# Patient Record
Sex: Male | Born: 1935 | Race: White | Hispanic: No | Marital: Married | Smoking: Former smoker
Health system: Southern US, Community
[De-identification: ages and names within clinical notes are randomized; demographics above are authoritative.]

## PROBLEM LIST (undated history)

## (undated) DIAGNOSIS — E7800 Pure hypercholesterolemia, unspecified: Secondary | ICD-10-CM

## (undated) DIAGNOSIS — J309 Allergic rhinitis, unspecified: Secondary | ICD-10-CM

## (undated) DIAGNOSIS — A048 Other specified bacterial intestinal infections: Secondary | ICD-10-CM

## (undated) DIAGNOSIS — K635 Polyp of colon: Secondary | ICD-10-CM

## (undated) DIAGNOSIS — J189 Pneumonia, unspecified organism: Secondary | ICD-10-CM

## (undated) DIAGNOSIS — K4090 Unilateral inguinal hernia, without obstruction or gangrene, not specified as recurrent: Secondary | ICD-10-CM

## (undated) DIAGNOSIS — K219 Gastro-esophageal reflux disease without esophagitis: Secondary | ICD-10-CM

## (undated) DIAGNOSIS — K649 Unspecified hemorrhoids: Secondary | ICD-10-CM

## (undated) DIAGNOSIS — I10 Essential (primary) hypertension: Secondary | ICD-10-CM

## (undated) DIAGNOSIS — M1990 Unspecified osteoarthritis, unspecified site: Secondary | ICD-10-CM

## (undated) DIAGNOSIS — C4490 Unspecified malignant neoplasm of skin, unspecified: Secondary | ICD-10-CM

## (undated) DIAGNOSIS — I998 Other disorder of circulatory system: Secondary | ICD-10-CM

## (undated) HISTORY — DX: Unspecified hemorrhoids: K64.9

## (undated) HISTORY — DX: Other disorder of circulatory system: I99.8

## (undated) HISTORY — DX: Gastro-esophageal reflux disease without esophagitis: K21.9

## (undated) HISTORY — PX: SKIN CANCER EXCISION: SHX779

## (undated) HISTORY — DX: Unilateral inguinal hernia, without obstruction or gangrene, not specified as recurrent: K40.90

## (undated) HISTORY — DX: Polyp of colon: K63.5

## (undated) HISTORY — DX: Pure hypercholesterolemia, unspecified: E78.00

## (undated) HISTORY — DX: Unspecified malignant neoplasm of skin, unspecified: C44.90

## (undated) HISTORY — DX: Pneumonia, unspecified organism: J18.9

## (undated) HISTORY — PX: OTHER SURGICAL HISTORY: SHX169

## (undated) HISTORY — DX: Essential (primary) hypertension: I10

## (undated) HISTORY — DX: Allergic rhinitis, unspecified: J30.9

## (undated) HISTORY — DX: Unspecified osteoarthritis, unspecified site: M19.90

## (undated) HISTORY — PX: EXCISIONAL HEMORRHOIDECTOMY: SHX1541

## (undated) HISTORY — DX: Other specified bacterial intestinal infections: A04.8

---

## 1999-06-03 ENCOUNTER — Encounter (INDEPENDENT_AMBULATORY_CARE_PROVIDER_SITE_OTHER): Payer: Self-pay | Admitting: Specialist

## 1999-06-03 ENCOUNTER — Other Ambulatory Visit: Admission: RE | Admit: 1999-06-03 | Discharge: 1999-06-03 | Payer: Self-pay | Admitting: Gastroenterology

## 1999-10-12 DIAGNOSIS — A048 Other specified bacterial intestinal infections: Secondary | ICD-10-CM

## 1999-10-12 HISTORY — DX: Other specified bacterial intestinal infections: A04.8

## 2003-03-13 DIAGNOSIS — K648 Other hemorrhoids: Secondary | ICD-10-CM | POA: Insufficient documentation

## 2006-01-07 ENCOUNTER — Ambulatory Visit (HOSPITAL_BASED_OUTPATIENT_CLINIC_OR_DEPARTMENT_OTHER): Admission: RE | Admit: 2006-01-07 | Discharge: 2006-01-07 | Payer: Self-pay | Admitting: Orthopedic Surgery

## 2006-05-02 ENCOUNTER — Inpatient Hospital Stay (HOSPITAL_COMMUNITY): Admission: RE | Admit: 2006-05-02 | Discharge: 2006-05-05 | Payer: Self-pay | Admitting: Orthopedic Surgery

## 2007-06-27 ENCOUNTER — Ambulatory Visit: Payer: Self-pay | Admitting: Gastroenterology

## 2007-07-05 ENCOUNTER — Ambulatory Visit: Payer: Self-pay | Admitting: Gastroenterology

## 2007-07-05 ENCOUNTER — Encounter: Payer: Self-pay | Admitting: Gastroenterology

## 2007-07-05 DIAGNOSIS — K5730 Diverticulosis of large intestine without perforation or abscess without bleeding: Secondary | ICD-10-CM | POA: Insufficient documentation

## 2007-07-05 DIAGNOSIS — D126 Benign neoplasm of colon, unspecified: Secondary | ICD-10-CM

## 2007-12-27 DIAGNOSIS — I10 Essential (primary) hypertension: Secondary | ICD-10-CM

## 2007-12-27 DIAGNOSIS — M159 Polyosteoarthritis, unspecified: Secondary | ICD-10-CM

## 2007-12-29 ENCOUNTER — Ambulatory Visit (HOSPITAL_COMMUNITY): Admission: RE | Admit: 2007-12-29 | Discharge: 2007-12-29 | Payer: Self-pay | Admitting: General Surgery

## 2011-02-23 NOTE — Assessment & Plan Note (Signed)
[facility]                         GASTROENTEROLOGY OFFICE NOTE   Eric Pratt, Eric Pratt                  MRN:          [id number]  DATE:06/27/2007                            DOB:          [birth year]    Eric Pratt is a 76 year old white male referred by Dr. Sherril Croon for  evaluation of right lower quadrant pain.   Eric Pratt has been without gastrointestinal problems until the last  couple of months when he has had a burning and sharp sensation in his  right groin area which is made worse by lifting and bending.  He had  been wearing a truss with mild improvement in his pain.  He really  denies change in bowel habits but has had some rectal bleeding recently,  requiring sclerotherapy injection by Dr. Kendrick Ranch.  He also has a past  history of a hemorrhoidectomy many years ago.  I did a screening  colonoscopy on him with screening of adenomatous polyps which he had in  the past, last done in June of 2004.   The patient does have chronic acid reflux for which he takes Prilosec 20  mg daily. He also suffers from hypertension and is under the care of Dr.  Sherril Croon.  Because of his recent abdominal pain he had a CT scan of the  abdomen on May 24, 2007 which was normal except for a 1 cm hepatic  cyst. They did see a small hiatal hernia and some degenerative scoliosis  of the spine but without CT scan was unremarkable.  I do not have any  other recent laboratories.   PAST MEDICAL HISTORY:  Otherwise is remarkable for hypertension for the  last 15 years, degenerative arthritis, and he has had skin cancers  excised, and has had hemorrhoid surgery as mentioned above.  He also had  a varicocele repaired 40 years ago and a left knee replacement in 2007.   MEDICATIONS:  Prilosec 20 mg daily, Norvasc 5 mg daily, a variety of  multivitamins and supplements.   ALLERGIES:  He denies drug allergies except for SULFA MEDICATIONS.   FAMILY HISTORY:  Remarkable  for breast cancer in his mother. Both his  father and his brother have prostate cancer.   SOCIAL HISTORY:  He is married and lives with his wife.  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  Does not smoke. Uses ethanol socially.   REVIEW OF SYSTEMS:  Really noncontributory.  He denies NSAID abuse since  his right knee replacement.  He denies any current cardiovascular or  pulmonary complaints.   EXAMINATION:  GENERAL:  He is a healthy appearing white male in no  distress, looking younger than his stated age.  He is 6 feet tall and  weighs 205 pounds.  VITAL SIGNS:  Blood pressure 116/78, pulse 76 and regular. Cannot  appreciate stigmata of chronic liver disease.  ABDOMEN:  Exam was benign without organomegaly, masses, or tenderness.  I could not demonstrate a hernia in the right groin area and a urologic  exam was unremarkable.  RECTAL:  Unremarkable. Stool tested was Guaiac negative.  NEURO:  Mental status was clear.   ASSESSMENT:  Eric Pratt has right lower quadrant pain which sounds  like a right inguinal hernia although this could not be demonstrated  today on exam. I suspect he has a direct hernia there and will ask Dr.  Earlene Plater to evaluate him if his colonoscopy is unremarkable.  He obviously  had some hemorrhoidal bleeding recently which has resolved with  sclerotherapy but he has a history of recurrent colon polyps and is due  for a followup colonoscopy exam.   RECOMMENDATIONS:  1. Outpatient colonoscopy at his convenience.  2. Continue all his medications listed above.  3. Probable surgical referral once the colonoscopy is completed.     Vania Rea. Jarold Motto, MD, Caleen Essex, FAGA  Electronically Signed    DRP/MedQ  DD: 06/27/2007  DT: 06/27/2007  Job #: [id number]   cc:   Doreen Beam

## 2011-02-23 NOTE — Op Note (Signed)
NAME:  Eric Pratt, Eric Pratt NO.:  [id number]   MEDICAL RECORD NO.:  [id number]          PATIENT TYPE:  AMB   LOCATION:  DAY                          FACILITY:  [facility]   PHYSICIAN:  Angelia Mould. Derrell Lolling, M.D.DATE OF BIRTH:  1935-09-29   DATE OF PROCEDURE:  12/29/2007  DATE OF DISCHARGE:                               OPERATIVE REPORT   PREOPERATIVE DIAGNOSIS:  Right inguinal hernia.   POSTOPERATIVE DIAGNOSIS:  Right inguinal hernia.   OPERATION PERFORMED:  Repair right inguinal hernia with mesh  Armanda Heritage repair).   SURGEON:  Dr. Claud Kelp.   OPERATIVE INDICATIONS:  This is a 76 year old white man in good health.  He has a six month history of a bulge in his right groin that has become  painful.  This has required wearing a truss.  He wants to have the  hernia repaired.  On exam, he has a large right inguinal hernia that  does not extend into the scrotum which is reducible, no evidence of  hernia elsewhere.  He is brought to the operating room electively.   OPERATIVE FINDINGS:  The patient had a large indirect right inguinal  hernia.  The floor of the inguinal canal was quite thinned out but was  not bulging.   OPERATIVE TECHNIQUE:  Following the induction of general LMA anesthetic,  the patient's abdomen and right groin and genitalia were prepped and  draped in a sterile fashion.  The patient was identified as the correct  patient, correct procedure and correct site.  Intravenous antibiotics  were given.  0.5% Marcaine with epinephrine was used as a local  infiltration anesthetic.  A slightly angled transverse incision was made  in the right groin overlying the inguinal canal.  Dissection was carried  down through the subcutaneous tissue exposing the aponeurosis of the  external oblique.  The external oblique was incised in the direction of  its fibers opening up the external inguinal ring.  The external oblique  was dissected away from the underlying  tissues and self-retaining  retractors were placed.  A small sensory nerve was found to be  intimately associated with the cord structures.  This was traced  laterally to its emergence from the muscle clamped at that site, divided  and ligated with a 2-0 silk tie to prevent neuroma formation.  The nerve  was then debrided.  The cord structures were mobilized and encircled  with a Penrose drain.  Cremasteric muscle fibers were skeletonized.  A  small lipoma was debrided away from the cord structures.  A very large  indirect hernia sac was dissected away from cord structures all the way  back to the level of the internal ring.  The hernia sac was opened and  inspected.  I could see one loop of small bowel below the muscle fascia  but there were no other adhesions or masses.  I then twisted the  indirect hernia sac and then suture ligated it at the level of the  internal ring with a suture ligature of 2-0 silk.  The redundant sac was  excised and discarded.  The floor of the inguinal  canal was then  repaired and reinforced with a 3 inch x 6 inch piece of Ultrapro mesh.  The mesh was trimmed at the corners to accommodate the wound.  The mesh  was sutured in place with running sutures of 2-0 Prolene and interrupted  sutures of 2-0 Prolene.  The mesh was sutured so as to generously  overlap the fascia at the pubic tubercle, along the inguinal ligament  inferiorly.  Medially and superomedially five or six interrupted  mattress sutures of 2-0 Prolene were placed to secure the mesh.  Superolaterally a running suture of 2-0 Prolene was used.  The mesh was  incised laterally so as to wrap around the cord structures at the  internal ring.  The tails of the mesh were overlapped laterally and  suture lines completed.  This provided a very secure repair both medial  and lateral to the cord structures but allowed for an adequate opening  fingertip size to allow the cord structures to emerge.  The wound  was  irrigated with saline, hemostasis was excellent.  The external oblique  was closed with running suture of 2-0 Vicryl placing the cord structures  deep to the external oblique.  Scarpa fascia was closed with 3-0 Vicryl  sutures and the skin closed with a running subcuticular suture of 4-0  Monocryl and Dermabond.  Bandages placed.  Ice pack was placed, the  patient taken to the recovery room in stable condition.   ESTIMATED BLOOD LOSS:  15 mL.   COMPLICATIONS:  None.   Sponge, needle and instrument counts were correct.      Angelia Mould. Derrell Lolling, M.D.  Electronically Signed     HMI/MEDQ  D:  12/29/2007  T:  12/29/2007  Job:  [id number]   cc:   Doreen Beam, MD  Fax: [phone]

## 2011-02-26 NOTE — H&P (Signed)
NAME:  Eric Pratt, Eric Pratt NO.:  [id number]   MEDICAL RECORD NO.:  [id number]          PATIENT TYPE:  INP   LOCATION:  NA                           FACILITY:  [facility]   PHYSICIAN:  Ollen Gross, M.D.    DATE OF BIRTH:  [birth year]   DATE OF ADMISSION:  05/02/2006  DATE OF DISCHARGE:                                HISTORY & PHYSICAL   CHIEF COMPLAINT:  Left knee pain.   HISTORY OF PRESENT ILLNESS:  A 76 year old male has been seen by Dr. Homero Fellers  Aluisio for ongoing left knee pain.  He is known to have significant end-  stage arthritis.  He has developed bone-on-bone with varus deformity.  He  has undergone arthroscopy in the past but his pain continues to worsen.  It  is felt he would benefit from undergoing knee replacement.  Risks and  benefits have been discussed and he has elected to proceed with surgery.   ALLERGIES:  GANTRISIN which is an old sulfa drug.  He is able to take Septra  and Bactrim without difficulty.   CURRENT MEDICATIONS:  Norvasc, Prilosec, Allegra, Flexeril, Percocet.   PAST MEDICAL HISTORY:  1. Hypertension.  2. History of irregular heart rate in the past felt to be due to vagal      stimulation about 20 years ago, none recently.  3. Reflux disease.   PAST SURGICAL HISTORY:  1. Hemorrhoid surgery.  2. Left leg vein stripping.  3. He has undergone five colonoscopy in the past with multiple      polypectomies, all of which have been benign.  4. Variceal surgery.  5. Tonsillectomy.   SOCIAL HISTORY:  Married.  Nonsmoker now; he did quit about 25 years ago.  One drink of alcohol per day.  He has two daughters.   FAMILY HISTORY:  Mother history of melanoma breast cancer.  Father with  history of prostate cancer and Alzheimer.  Brother history of colon and  prostate and also melanoma.   REVIEW OF SYSTEMS:  GENERAL:  No fevers, chills, night sweats.  NEUROLOGICAL:  No seizures, syncope or paralysis.  RESPIRATORY:  No  shortness of breath,  productive cough or hemoptysis.  CARDIOVASCULAR:  No  chest pain, angina, or orthopnea.  GI:  No nausea, vomiting, diarrhea or  constipation.  GU:  No dysuria, hematuria or discharge.  MUSCULOSKELETAL:  Left knee.   PHYSICAL EXAMINATION:  VITAL SIGNS:  Pulse 68, respiration 14, blood  pressure 130/84.  GENERAL:  A 76 year old white male well-developed, well-nourished in no  acute distress.  Alert, oriented and cooperative.  Very pleasant.  HEENT:  Normocephalic and atraumatic.  Pupils are equal, round, and  reactive.  Oropharynx clear.  EMs intact.  Does have full upper denture  plate.  CHEST:  Clear anterior and posterior chest wall.  There is no rhonchi, rubs  or wheezing.  HEART:  Regular rate and rhythm.  No murmur.  ABDOMEN:  Soft and nontender.  Bowel sounds present.  RECTAL:  Not done, not pertinent to present illness.  BREASTS:  Not done, not pertinent to present illness.  GENITALIA:  Not done,  not pertinent to present illness.  EXTREMITIES:  Left knee shows range of motion from 10 to 110 degrees range  of motion, very tender.  Crepitus is noted.  Tender more laterally.  No  instability.   IMPRESSION:  1. Osteoarthritis left knee.  2. Hypertension.  3. Past history of irregular heart rate about 20 years ago.  4. Reflux disease.   PLAN:  The patient admitted to [facility] to undergo a left total  knee arthroplasty.  Surgery will be performed by Dr. Ollen Gross.      Alexzandrew L. Julien Girt, P.A.      Ollen Gross, M.D.  Electronically Signed    ALP/MEDQ  D:  05/01/2006  T:  05/01/2006  Job:  [id number]   cc:   Doreen Beam  Fax: [phone]   Patient's chart

## 2011-02-26 NOTE — Op Note (Signed)
NAME:  Eric Pratt, Eric Pratt NO.:  [id number]   MEDICAL RECORD NO.:  [id number]          PATIENT TYPE:  AMB   LOCATION:  [facility]                         FACILITY:  [facility]   PHYSICIAN:  Ollen Gross, M.D.    DATE OF BIRTH:  1935/02/02   DATE OF PROCEDURE:  01/07/2006  DATE OF DISCHARGE:                                 OPERATIVE REPORT   PREOPERATIVE DIAGNOSIS:  Left knee medial meniscal tear.   POSTOPERATIVE DIAGNOSIS:  Left knee medial meniscal tear.   PROCEDURE:  Left knee arthroscopy with medial meniscal debridement.   SURGEON:  Ollen Gross, M.D.   ASSISTANT:  None.   ANESTHESIA:  Local with MAC.   ESTIMATED BLOOD LOSS:  Minimal.   DRAIN:  None.   COMPLICATIONS:  None.   CONDITION.:  Stable to recovery.   BRIEF CLINICAL NOTE:  [name] is a 76 year old male with a several-  month history of progressive worsening left knee pain and mechanical  symptoms.  He has a medial meniscal tear as well as some degenerative change  medially.  He has had significant mechanical symptoms and presents for  arthroscopy and debridement.   PROCEDURE IN DETAIL:  After successful initiation of local with MAC  anesthetic, a tourniquet is placed high on the left thigh and the left lower  extremity prepped and draped in the usual sterile fashion.  Standard  superomedial and inferolateral incisions were made, inflow cannula passed  superomedial and camera passed inferolateral.  Arthroscopic visualization  proceeds.  Undersurface of patella and trochlea looked fairly normal.  Medial and lateral gutters were visualized.  There were no loose bodies.  Flexion and valgus force applied to the knee and the medial compartment was  entered.  He has evidence of a significant tear, body and posterior horn of  the medial meniscus.  In addition, he has exposed bone on the medial tibial  plateau as well as the medial femoral condyle, fairly large areas of each.  A spinal needle is used to  localize the inferomedial portal.  A small  incision is made, dilator placed, and then we debrided the meniscus back to  a stable base with baskets and a 4.2 mm shaver.  We did not need to do any  chondroplasty, as the remaining cartilage did appear normal.  The entire  notch was then visualized, ACL was normal.  Lateral compartment was entered,  and it is normal.  The arthroscopic equipment is removed from the  inferior portals, which are closed with interrupted 4-0 nylon.  Marcaine  0.25% with epinephrine 20 mL are injected through the inflow cannula, and  that is removed and that portal closed with nylon.  A bulky sterile dressing  is applied and he is awakened and transported to recovery in stable  condition.      Ollen Gross, M.D.  Electronically Signed     FA/MEDQ  D:  01/07/2006  T:  01/10/2006  Job:  [id number]

## 2011-02-26 NOTE — Op Note (Signed)
NAME:  Eric Pratt, Eric Pratt NO.:  [id number]   MEDICAL RECORD NO.:  [id number]          PATIENT TYPE:  INP   LOCATION:  1514                         FACILITY:  [facility]   PHYSICIAN:  Ollen Gross, M.D.    DATE OF BIRTH:  02/16/1935   DATE OF PROCEDURE:  05/02/2006  DATE OF DISCHARGE:                                 OPERATIVE REPORT   PREOP DIAGNOSIS:  Osteoarthritis left knee.   POSTOPERATIVE DIAGNOSIS:  Osteoarthritis left knee.   PROCEDURE:  Left total knee arthroplasty.   SURGEON:  Ollen Gross, M.D.   ASSISTANT:  Alexzandrew L. Julien Girt, P.A.   ANESTHESIA:  General with postop Marcaine pain pump.   ESTIMATED BLOOD LOSS:  Minimal.   DRAIN:  Hemovac x1 tourniquet time 41 minutes at 300 mmHg.   COMPLICATIONS:  None.   CONDITION:  Stable to recovery.   BRIEF CLINICAL NOTE:  Mr. Fountain is a 76 year old male who has end-stage  arthritic change involving the left knee.  He has had progressive worsening  pain and dysfunction and presents now for total knee arthroplasty.   PROCEDURE IN DETAIL:  After successful administration of general anesthetic  a tourniquet is placed high in the left thigh; and the left lower extremity  is prepped and draped in the usual sterile fashion.  Extremity is wrapped in  Esmarch, knee flexed, tourniquet inflated to 300 mmHg.  A midline incision  is made with a 10-blade through the subcutaneous tissue to the level of the  extensor mechanism.  A fresh blade is used to make a medial parapatellar  arthrotomy.  Soft tissue of the proximal medial tibia is subperiosteally  elevated to the joint line with the knife and into the semimembranosus bursa  with a Cobb elevator.  Soft tissue of the proximal lateral tibia is also  elevated with attention being paid to avoid the patellar tendon on the  tibial tubercle.  Patella subluxed laterally, knee flexed to 90 degrees, and  ACL and PCL removed.  A drill was used to create a starting hole in  the  distal femur canal; and this was thoroughly irrigated.  A 50degree left  valgus alignment guide is placed, and referencing off the posterior condyles  as rotation is marked and the block pins to remove 12-mm of the distal  femur.  I took the additional distal femoral resection because of a large  flexion contracture.  Sizing blocks placed as 5 as most appropriate.  The  rotations marked off the epicondylar axis and size 5 cutting block is  placed.  The anterior, posterior, and chamfer cuts are made.   The tibia subluxed forward and the menisci removed.  Extramedullary tibial  alignment guide is placed referencing proximally to medial aspect of the  tibial tubercle and distally along the second metatarsal axis and tibial  crest.  Block is pinned to remove 10-mm off the nondeficient lateral side.  Tibial resection is made with an oscillating saw.  This did get down to the  base of the medial defect.  I took off the medial osteophytes.  A size 5 is  most appropriate tibial  component; and the proximal tibia is prepared to  modular drill and keel punch for a size 5.  Femoral preparation is completed  with the intercondylar cut.   Size 5 mobile bearing tibial trial with a size 5 posterior stabilized  femoral trial, and a 10-mm posterior stabilized rotating platform insert  trial were placed.  With the 10 we did achieve full extension with excellent  varus and valgus balance throughout full range of motion.  The patella was  then everted and thickness measured to be 26 mm.  A freehand resection is  taken to 15 mm, a 41 template is placed, lug holes were drilled, a trial  patella was placed; and it tracks normally.  The osteophytes are then  removed off the posterior femur with the trial in place.  All trials were  removed and the cut bone surface was prepared with pulsatile lavage.  Cement  was mixed and once ready for implantation a size 5 mobile bearing tibial  tray, a size 5 posterior  stabilized femur; and 41 patella are cemented into  place; and the patella is held with a clamp.  A trial 10-mm insert is placed  and the knee held in full extension; and all extruded cement removed.  Once  the cement has fully hardened, and the permanent 10-mm posterior stabilized  rotating platform insert is placed into the tibial tray.  The wound was  copiously irrigated with saline solution and the extensor mechanism closed  over Hemovac drain.  With interrupted #1 PDS.  Flexion against gravity is  135 degrees.   The subcu is closed with interrupted 2-0 Vicryl and subcuticular running 4-0  Monocryl.  The catheter for the Marcaine pain pump is placed and the pump is  initiated.  Drain is hooked to suction, and Steri-Strips, and a bulky  sterile dressing applied.  He is then awakened and transported to recovery  in stable condition.      Ollen Gross, M.D.  Electronically Signed     FA/MEDQ  D:  05/02/2006  T:  05/02/2006  Job:  [id number]

## 2011-02-26 NOTE — Discharge Summary (Signed)
NAME:  Eric Pratt, Eric Pratt NO.:  [id number]   MEDICAL RECORD NO.:  [id number]          PATIENT TYPE:  INP   LOCATION:  1514                         [facility]ACILITY:  [facility]   PHYSICIAN:  Ollen Gross, M.D.    DATE OF BIRTH:  10-Jan-1935   DATE OF ADMISSION:  05/02/2006  DATE OF DISCHARGE:  05/05/2006                                 DISCHARGE SUMMARY   ADMISSION DIAGNOSES:  1. Osteoarthritis of the left knee.  2. Hypertension.  3. History of irregular heart rate about 20 years ago.  4. Reflux disease.   DISCHARGE DIAGNOSES:  1. Osteoarthritis of the left knee status post left total knee      arthroplasty.  2. Acute blood loss anemia that did not require transfusion.  3. Hypertension.  4. History of irregular heart rate about 20 years ago.  5. Reflux disease.   PROCEDURE:  Left total knee done on May 02, 2006. Surgeon Dr. Lequita Halt,  assistant Avel Peace, PA-C, anesthesia general.   CONSULTATIONS:  None.   BRIEF HISTORY:  Mr. Eric Pratt is a 76 year old male with end-stage arthritis  involving the left knee with progressive worsening of pain and dysfunction  and now presents for a total knee.   LABORATORY DATA:  Preop CBC, hemoglobin 12.7, hematocrit 37.4, normal white  count 5.2. Postop hemoglobin showed a drop in hemoglobin of 11.3 with an  increase in white cell count down to 10.8. White cell count came back down  to normal level at 7.2, hemoglobin drifted down to 8.8 with hematocrit of  23.9. PT and PTT on admission 14.1 and 34 respectively. INR 1.1, serial  protimes followed. Last noted PT/INR 27.2 and 2.4. Chem panel on admission  all within normal limits. Serial BMETs are followed. Electrolytes remained  within normal limits. . Urinalysis with green color, suggested biochemicals  may have affected the color, otherwise clear. Urinalysis negative. Blood  group type O positive.   EKG dated April 05, 2000, sinus tachycardia, right bundle branch block, no  old  tracing to compare,. Two-view chest April 28, 2006, no active disease.   HOSPITAL COURSE:  Admitted to [facility], tolerated procedure  well, later transferred to the recovery room and then to the orthopedic  floor, started on PCA and p.o. analgesics for pain control following his  surgery. He was able to get some rest on the night of day 1. He did have  some drainage so we left the drain in on day 1. He started getting up out of  bed. He had a little bit of slow urinary output so we could get the fluids.  The hemoglobin was stable. By day 2, the patient is doing better, the pain  was under better control, drain was removed. The main complaint was some  reflux. The dressing was changed, did have a little bit of bloody drainage  from the Hemovac which was backed out. Started on Reglan and was already on  Protonix. Recommended a suppository for bowel improvement. Started getting  up out of bed. Did well with therapy ambulating 150 feet and later 120 feet  that same day  on day 2. By day 3, the patient was doing much better.  Hemoglobin dropped down to 8.8; however, the patient had no symptoms,  getting around with therapy. Pain under good control at discharge.   PLAN:  1. The patient discharged home on July 26.  2. Discharge diagnoses, please see above.  3. Discharge medications:  Nu-Iron, Coumadin, Lexapro, Percocet.  4. Diet - resume previous home diet.  5. Followup - 2 weeks.  6. Activity - home health PT and home health nursing. Weightbearing as      tolerated. Total knee protocol. He may resume his splint at home to use      30 minutes at a time 3 times a day to help out with extension.   DISPOSITION:  Home.   CONDITION ON DISCHARGE:  Improved.      Alexzandrew L. Julien Girt, P.A.      Ollen Gross, M.D.  Electronically Signed    ALP/MEDQ  D:  06/15/2006  T:  06/15/2006  Job:  [id number]   cc:   Doreen Beam  Fax: [phone]

## 2011-07-02 LAB — BASIC METABOLIC PANEL
BUN: 13
Calcium: 8.6
Creatinine, Ser: 1.08
GFR calc non Af Amer: >60
Potassium: 3.8

## 2011-07-02 LAB — HEMOGLOBIN AND HEMATOCRIT, BLOOD
HCT: 39.2
Hemoglobin: 13.7

## 2011-07-05 LAB — BASIC METABOLIC PANEL
GFR calc non Af Amer: >60
Glucose, Bld: 89
Potassium: 4.6

## 2011-07-05 LAB — HEMOGLOBIN AND HEMATOCRIT, BLOOD
HCT: 39.1
Hemoglobin: 13.5

## 2012-11-10 ENCOUNTER — Other Ambulatory Visit: Payer: Self-pay | Admitting: Physician Assistant

## 2013-11-13 ENCOUNTER — Encounter: Payer: Self-pay | Admitting: Neurology

## 2013-11-13 ENCOUNTER — Ambulatory Visit (INDEPENDENT_AMBULATORY_CARE_PROVIDER_SITE_OTHER): Payer: Medicare Other | Admitting: Neurology

## 2013-11-13 VITALS — BP 128/76 | HR 70 | Temp 97.0°F | Resp 18 | Ht 72.0 in | Wt 192.0 lb

## 2013-11-13 DIAGNOSIS — I679 Cerebrovascular disease, unspecified: Secondary | ICD-10-CM

## 2013-11-13 DIAGNOSIS — R410 Disorientation, unspecified: Secondary | ICD-10-CM

## 2013-11-13 DIAGNOSIS — I6350 Cerebral infarction due to unspecified occlusion or stenosis of unspecified cerebral artery: Secondary | ICD-10-CM

## 2013-11-13 DIAGNOSIS — R413 Other amnesia: Secondary | ICD-10-CM

## 2013-11-13 DIAGNOSIS — I639 Cerebral infarction, unspecified: Secondary | ICD-10-CM

## 2013-11-13 DIAGNOSIS — R404 Transient alteration of awareness: Secondary | ICD-10-CM

## 2013-11-13 NOTE — Progress Notes (Signed)
NEUROLOGY CONSULTATION NOTE  Eric Pratt MRN: [id number] DOB: Mar 10, 1935  Referring provider: Dr. Woody Seller Primary care provider: Dr. Woody Seller  Reason for consult:  Confusion, stroke  HISTORY OF PRESENT ILLNESS: Eric Pratt is a 79 year old right-handed man with history of hypertension, hypercholesterolemia, GERD, and former smoker who presents for evaluation of confusion and stroke.  He is accompanied by his wife and daughter.  Records and images were personally reviewed where available.    During the holidays, he had an episode of confusion.  He was driving a friend home, when suddenly he stopped off and parked in the [employer] parking lot.  His goddaughter happened to be walking out and stopped by.  He does not remember this.  He then drove home, instead of dropping off his friend, and walked inside the house, leaving his friend in the car.  A few minutes later, his friend came to the door.  Again, he had no recollection of this.  Later that night, he complained of right shoulder pain and he went to the ED.  They waited for over 2 hours and he was back to normal, so they decided to leave.  They saw their PCP the next day.  He was having upper respiratory symptoms.  His WBC was 13.4.  CT of the chest revealed pulmonary fibrosis and pneumonia.  He was started on Levoquin.  His blood pressure was 90/60.  Amlodipine was discontinued.  An MRI of the brain was ordered and revealed significant chronic small vessel disease and lacunar infarcts.  He doesn't have a history of significant memory or behavioral problems.  He may forget a couple of things here and there.  One time that stands out, occurred several years ago.  He was told to go to the grocery store and get certain items.  He came back having bought different items.  However, he goes on errands all the time with no problems.  He had a left knee replacement 5 years ago and had some trouble with memory for the next couple of years.  It was  attributed to effect to anesthesia.  He files his own taxes without problems, except last year he did forget a couple of things which they took care of this year.  He drives without difficulty.  His father had dementia.  He is not on blood thinners.  He said he took an aspirin once several years ago at the beach and got a rash.  However, it could have been a reaction to sunscreen.  He is a retired Software engineer.  10/10/13 LABS:  TSH 0.943, B12 306, WBC 13.4, Hgb 14, HCT 40.6, PLT 171, Na 141, K 3.7, Cl 98, BUN 18, Cr 1.33, glucose 79, AP 58, AST 25, ALT 17. 10/12/13 MRI HEAD WO:  remote bilateral basal ganglia lacunar infarcts, moderate to severe chronic small vessel ischemic changes, moderate global parenchymal brain volume loss, upper limits of normal for patient's age (I have report but not actual scan to review).  PAST MEDICAL HISTORY: Past Medical History  Diagnosis Date  . Inguinal hernia   . Hemorrhoids   . Colon polyps   . Allergic rhinitis   . Hypertension   . Arthritis     hands  . H. pylori infection 2001  . Skin cancer     squamous cell  . Hypercholesterolemia   . GERD (gastroesophageal reflux disease)   . Ischemia     small vessel/lacunar infarction  . Pneumonia  PAST SURGICAL HISTORY: Past Surgical History  Procedure Laterality Date  . Skin cancer excision      squamous cell  . Inguinal hernia surgery    . Excisional hemorrhoidectomy      MEDICATIONS: No current outpatient prescriptions on file prior to visit.   No current facility-administered medications on file prior to visit.    ALLERGIES: Allergies  Allergen Reactions  . Bactrim Ds [Sulfamethoxazole-Tmp Ds] Rash    FAMILY HISTORY: Family History  Problem Relation Age of Onset  . Colon cancer Brother   . Prostate cancer Brother   . Melanoma Mother   . Breast cancer Mother     SOCIAL HISTORY: History   Social History  . Marital Status: Married    Spouse Name: N/A    Number of Children: N/A    . Years of Education: N/A   Occupational History  . Not on file.   Social History Main Topics  . Smoking status: Former Research scientist (life sciences)  . Smokeless tobacco: Former Systems developer    Types: Chew     Comment: quit 1985  . Alcohol Use: 0.5 oz/week    1 drink(s) per week  . Drug Use: Not on file  . Sexual Activity: Not on file   Other Topics Concern  . Not on file   Social History Narrative  . No narrative on file    REVIEW OF SYSTEMS: Constitutional: No fevers, chills, or sweats, no generalized fatigue, change in appetite Eyes: No visual changes, double vision, eye pain Ear, nose and throat: hearing loss Cardiovascular: No chest pain, palpitations Respiratory:  No shortness of breath at rest or with exertion, wheezes GastrointestinaI: No nausea, vomiting, diarrhea, abdominal pain, fecal incontinence Genitourinary:  No dysuria, urinary retention or frequency Musculoskeletal:  No neck pain, back pain Integumentary: No rash, pruritus, skin lesions Neurological: as above Psychiatric: No depression, insomnia, anxiety Endocrine: No palpitations, fatigue, diaphoresis, mood swings, change in appetite, change in weight, increased thirst Hematologic/Lymphatic:  No anemia, purpura, petechiae. Allergic/Immunologic: no itchy/runny eyes, nasal congestion, recent allergic reactions, rashes  PHYSICAL EXAM: Filed Vitals:   11/13/13 0926  BP: 128/76  Pulse: 70  Temp: 97 F (36.1 C)  Resp: 18   General: No acute distress Head:  Normocephalic/atraumatic Neck: supple, no paraspinal tenderness, full range of motion Back: No paraspinal tenderness Heart: regular rate and rhythm Lungs: Clear to auscultation bilaterally. Vascular: No carotid bruits. Neurological Exam: Mental status: alert and oriented to person, place, and time, speech fluent and not dysarthric, language intact.  Serial 7 subtraction intact.  Recalls 4 out of 5 words after 5 minutes.  Did not complete Trail Making Test correctly, but was  able to correctly copy a cube and draw a clock.  Mildly reduced naming fluency.  Abstraction intact.  MOCA 27/30 Cranial nerves: CN I: not tested CN II: pupils equal, round and reactive to light, visual fields intact, fundi unremarkable. CN III, IV, VI:  full range of motion, no nystagmus, no ptosis CN V: facial sensation intact CN VII: upper and lower face symmetric CN VIII: reduced hearing bilaterally CN IX, X: gag intact, uvula midline CN XI: sternocleidomastoid and trapezius muscles intact CN XII: tongue midline Bulk & Tone: normal, no fasciculations. Motor: 5/5 throughout Sensation: pinprick and vibration intact Deep Tendon Reflexes: 2+ throughout, toes down Finger to nose testing: no dysmetria Heel to shin: no dysmetria Gait: normal stride, able to turn and walk in tandem.  Does lean a bit to the left. Romberg negative.  IMPRESSION: 1.  Episodic confusion.  Likely delirium due to pneumonia.  Mild cognitive changes on MOCA, but nothing significant. 2.  Cerebrovascular disease  PLAN: 1.  Will try ASA 81mg  daily again. 2.  Will get labs from PCP.  Should check Hgb A1c and LDL.  LDL goal should at least be less than 100. 3.  Carotid doppler 4.  2D Echo 5.  Mediterranean diet and exercise 6.  Check methylmalonic acid level 7.  Follow up in 6 months.  60 minutes spent with patient, over 50% spent counseling and coordinating care.  Thank you for allowing me to take part in the care of this patient.  Metta Clines, DO  CC:  Jerene Bears, MD

## 2013-11-13 NOTE — Patient Instructions (Addendum)
Most likely the confusion was delirium due to the pneumonia.  The MRI of the brain does show evidence of old strokes, and this can affect cognition.  However, your testing does not reveal any significant cognitive impairment.  Treatment for this would focus on secondary stroke prevention. 1.  Start aspirin 81mg  daily 2.  We will get records of your cholesterol.  LDL should be less than 100. 3.  We will get carotid dopplers and echocardiogram to look for other causes of stroke 4.  Mediterranean diet and exercise 5.  Follow up in 6 months. @d  Doppler  on Feb 6 at [facility] at 9:45am Echo at [place]

## 2013-11-15 ENCOUNTER — Telehealth: Payer: Self-pay | Admitting: *Deleted

## 2013-11-15 LAB — METHYLMALONIC ACID, SERUM: Methylmalonic Acid, Quant: 0.34 umol/L (ref <0.40)

## 2013-11-15 NOTE — Telephone Encounter (Signed)
error 

## 2013-11-16 ENCOUNTER — Ambulatory Visit (HOSPITAL_COMMUNITY): Payer: Medicare Other

## 2013-11-20 ENCOUNTER — Ambulatory Visit (HOSPITAL_COMMUNITY)
Admission: RE | Admit: 2013-11-20 | Discharge: 2013-11-20 | Disposition: A | Discharge status: Home / Self Care | Payer: Medicare Other | Source: Ambulatory Visit | Attending: Neurology | Admitting: Neurology

## 2013-11-20 DIAGNOSIS — Z8673 Personal history of transient ischemic attack (TIA), and cerebral infarction without residual deficits: Secondary | ICD-10-CM | POA: Insufficient documentation

## 2013-11-20 DIAGNOSIS — I639 Cerebral infarction, unspecified: Secondary | ICD-10-CM

## 2013-11-20 DIAGNOSIS — I6789 Other cerebrovascular disease: Secondary | ICD-10-CM

## 2013-11-20 DIAGNOSIS — I6350 Cerebral infarction due to unspecified occlusion or stenosis of unspecified cerebral artery: Secondary | ICD-10-CM

## 2013-11-20 DIAGNOSIS — I10 Essential (primary) hypertension: Secondary | ICD-10-CM | POA: Insufficient documentation

## 2013-11-20 DIAGNOSIS — I77819 Aortic ectasia, unspecified site: Secondary | ICD-10-CM | POA: Insufficient documentation

## 2013-11-20 NOTE — Progress Notes (Signed)
VASCULAR LAB PRELIMINARY  PRELIMINARY  PRELIMINARY  PRELIMINARY  Carotid duplex  completed.    Preliminary report: No significant ICA stenosis or plaque visualized.  Crystalann Korf, RVT 11/20/2013, 12:28 PM

## 2013-11-20 NOTE — Progress Notes (Signed)
  Echocardiogram 2D Echocardiogram has been performed.  Donata Clay 11/20/2013, 1:17 PM

## 2013-11-21 ENCOUNTER — Telehealth: Payer: Self-pay | Admitting: *Deleted

## 2013-11-28 NOTE — Telephone Encounter (Signed)
Patient is aware of results.

## 2015-01-17 ENCOUNTER — Ambulatory Visit (INDEPENDENT_AMBULATORY_CARE_PROVIDER_SITE_OTHER): Payer: Medicare Other | Admitting: Neurology

## 2015-01-17 ENCOUNTER — Encounter: Payer: Self-pay | Admitting: Neurology

## 2015-01-17 VITALS — BP 128/70 | HR 66 | Resp 20 | Ht 72.0 in | Wt 190.7 lb

## 2015-01-17 DIAGNOSIS — R4189 Other symptoms and signs involving cognitive functions and awareness: Secondary | ICD-10-CM

## 2015-01-17 DIAGNOSIS — Z8673 Personal history of transient ischemic attack (TIA), and cerebral infarction without residual deficits: Secondary | ICD-10-CM | POA: Diagnosis not present

## 2015-01-17 DIAGNOSIS — I679 Cerebrovascular disease, unspecified: Secondary | ICD-10-CM | POA: Diagnosis not present

## 2015-01-17 NOTE — Patient Instructions (Addendum)
1.  We will check MRI of the brain.  We have scheduled you at [facility] for your MRI on 02/06/2015 at 12:00pm. Please arrive 15 minutes prior and go to 1st floor radiology. If you need to reschedule for any reason please call [phone]. Get a CD of the MRI from last year at [facility] and bring it with you when you get the MRI so the radiologist can compare 2.  Check BMP and lipid panel 3.  Check EEG 4.  Follow up in 3 months.   5.  Based on tests, may need to make adjustments such as changing aspirin to Plavix

## 2015-01-17 NOTE — Progress Notes (Signed)
NEUROLOGY FOLLOW UP OFFICE NOTE  Eric Pratt [id number]  HISTORY OF PRESENT ILLNESS: Eric Pratt is a 80 year old right-handed man with history of hypertension, hypercholesterolemia, GERD, and former smoker who follows up for cerebrovascular disease and memory problems.  Labs, 2D echo and doppler reports reviewed.    UPDATE: He was last seen about a year ago.  He underwent a stroke workup.  2D echo showed EF 55-60%.  Carotid doppler showed 1-39% bilateral ICA stenosis.  At the time, he had a B12 of 306, so a methylmalonic acid level was checked, which was normal.  He was advised to take ASA 81mg  daily.  On Monday, he took a nap prior to the [place] game.  When he woke up, he seemed a little confused and wasn't acting right.  During the game, friends came over.  He sat there without saying a word.  He was lethargic and withdrawn.  There was no slurred speech or facial droop.  He was a little better the following morning but didn't really improve until later in the day.  His gait seemed a little slow too.  Since last year, he has had more memory problems.  He has forgotten how to start the car.  One time, he went into the pharmacy.  When he got out, he had realized that he left the key in the ignition and the battery was dead.  He used to handle all the finances.  He will still write the checks but he no longer can keep up with it and forgets to pay some bills.  He does well otherwise, such as going to a CE course for pharmacists.  His wife thinks this has been a gradual decline.  HISTORY: During the holidays, he had an episode of confusion.  He was driving a friend home, when suddenly he stopped off and parked in the [employer] parking lot.  His goddaughter happened to be walking out and stopped by.  He does not remember this.  He then drove home, instead of dropping off his friend, and walked inside the house, leaving his friend in the car.  A few minutes later, his friend came to the  door.  Again, he had no recollection of this.  Later that night, he complained of right shoulder pain and he went to the ED.  They waited for over 2 hours and he was back to normal, so they decided to leave.  They saw their PCP the next day.  He was having upper respiratory symptoms.  His WBC was 13.4.  CT of the chest revealed pulmonary fibrosis and pneumonia.  He was started on Levoquin.  His blood pressure was 90/60.  Amlodipine was discontinued.  An MRI of the brain was ordered and revealed significant chronic small vessel disease and lacunar infarcts.  He doesn't have a history of significant memory or behavioral problems.  He may forget a couple of things here and there.  One time that stands out, occurred several years ago.  He was told to go to the grocery store and get certain items.  He came back having bought different items.  However, he goes on errands all the time with no problems.  He had a left knee replacement 5 years ago and had some trouble with memory for the next couple of years.  It was attributed to effect to anesthesia.  He files his own taxes without problems, except last year he did forget a couple of things which  they took care of this year.  He drives without difficulty.  His father had dementia.  He is not on blood thinners.  He said he took an aspirin once several years ago at the beach and got a rash.  However, it could have been a reaction to sunscreen.  He is a retired Software engineer.  10/12/13 MRI HEAD WO:  remote bilateral basal ganglia lacunar infarcts, moderate to severe chronic small vessel ischemic changes, moderate global parenchymal brain volume loss, upper limits of normal for patient's age (I have report but not actual scan to review).  PAST MEDICAL HISTORY: Past Medical History  Diagnosis Date  . Inguinal hernia   . Hemorrhoids   . Colon polyps   . Allergic rhinitis   . Hypertension   . Arthritis     hands  . H. pylori infection 2001  . Skin cancer     squamous  cell  . Hypercholesterolemia   . GERD (gastroesophageal reflux disease)   . Ischemia     small vessel/lacunar infarction  . Pneumonia     MEDICATIONS: Current Outpatient Prescriptions on File Prior to Visit  Medication Sig Dispense Refill  . omeprazole (PRILOSEC) 20 MG capsule Take 20 mg by mouth daily.     No current facility-administered medications on file prior to visit.    ALLERGIES: Allergies  Allergen Reactions  . Bactrim Ds [Sulfamethoxazole-Trimethoprim] Rash    FAMILY HISTORY: Family History  Problem Relation Age of Onset  . Colon cancer Brother   . Prostate cancer Brother   . Melanoma Mother   . Breast cancer Mother   . Heart failure Mother   . Heart failure Brother     SOCIAL HISTORY: History   Social History  . Marital Status: Married    Spouse Name: N/A  . Number of Children: N/A  . Years of Education: N/A   Occupational History  . Not on file.   Social History Main Topics  . Smoking status: Former Research scientist (life sciences)  . Smokeless tobacco: Former Systems developer    Types: Chew     Comment: quit 1985  . Alcohol Use: 0.6 oz/week    1 Standard drinks or equivalent per week  . Drug Use: No  . Sexual Activity: No   Other Topics Concern  . Not on file   Social History Narrative    REVIEW OF SYSTEMS: Constitutional: No fevers, chills, or sweats, no generalized fatigue, change in appetite Eyes: No visual changes, double vision, eye pain Ear, nose and throat: No hearing loss, ear pain, nasal congestion, sore throat Cardiovascular: No chest pain, palpitations Respiratory:  No shortness of breath at rest or with exertion, wheezes GastrointestinaI: No nausea, vomiting, diarrhea, abdominal pain, fecal incontinence Genitourinary:  No dysuria, urinary retention or frequency Musculoskeletal:  No neck pain, back pain Integumentary: No rash, pruritus, skin lesions Neurological: as above Psychiatric: No depression, insomnia, anxiety Endocrine: No palpitations, fatigue,  diaphoresis, mood swings, change in appetite, change in weight, increased thirst Hematologic/Lymphatic:  No anemia, purpura, petechiae. Allergic/Immunologic: no itchy/runny eyes, nasal congestion, recent allergic reactions, rashes  PHYSICAL EXAM: Filed Vitals:   01/17/15 0932  BP: 128/70  Pulse: 66  Resp: 20   General: No acute distress Head:  Normocephalic/atraumatic Eyes:  Fundoscopic exam unremarkable without vessel changes, exudates, hemorrhages or papilledema. Neck: supple, no paraspinal tenderness, full range of motion Heart:  Regular rate and rhythm Lungs:  Clear to auscultation bilaterally Back: No paraspinal tenderness Neurological Exam: alert and oriented to person, place, and time.  Attention span and concentration intact, delayed recall poor, remote memory intact, fund of knowledge intact.  Speech fluent and not dysarthric, language overall intact with mild deficiencies.   Montreal Cognitive Assessment  01/17/2015  Visuospatial/ Executive (0/5) 4  Naming (0/3) 3  Attention: Read list of digits (0/2) 1  Attention: Read list of letters (0/1) 1  Attention: Serial 7 subtraction starting at 100 (0/3) 3  Language: Repeat phrase (0/2) 1  Language : Fluency (0/1) 0  Abstraction (0/2) 2  Delayed Recall (0/5) 1  Orientation (0/6) 5  Total 21  Adjusted Score (based on education) 21   CN II-XII intact. Fundoscopic exam unremarkable without vessel changes, exudates, hemorrhages or papilledema.  Bulk and tone normal, muscle strength 5/5 throughout.  Sensation to light touch, temperature and vibration intact.  Deep tendon reflexes 2+ throughout, toes downgoing.  Finger to nose and heel to shin testing intact.  Gait mildly wide-based and slow, Romberg negative.  IMPRESSION: Recurrent episodes of confusion with gradual cognitive decline.  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  PLAN: 1.  Will get MRI of the brain and compare to previous MRI from last year,  looking for any progression in small vessel disease 2.  Will check BMP and lipid panel 3.  Continue ASA for now but may need to switch to Plavix 4.  Since he has history of recurrent episodes of confusion, will get EEG. 5.  Follow up in 3 months.  Metta Clines, DO  CC:  Jerene Bears, MD

## 2015-01-21 ENCOUNTER — Other Ambulatory Visit: Payer: Medicare Other

## 2015-01-23 ENCOUNTER — Ambulatory Visit (INDEPENDENT_AMBULATORY_CARE_PROVIDER_SITE_OTHER): Payer: Medicare Other | Admitting: Neurology

## 2015-01-23 DIAGNOSIS — R4189 Other symptoms and signs involving cognitive functions and awareness: Secondary | ICD-10-CM

## 2015-01-23 DIAGNOSIS — I679 Cerebrovascular disease, unspecified: Secondary | ICD-10-CM | POA: Diagnosis not present

## 2015-01-23 DIAGNOSIS — Z8673 Personal history of transient ischemic attack (TIA), and cerebral infarction without residual deficits: Secondary | ICD-10-CM

## 2015-01-24 ENCOUNTER — Telehealth: Payer: Self-pay | Admitting: Family Medicine

## 2015-01-24 LAB — BASIC METABOLIC PANEL
BUN: 14 mg/dL (ref 6–23)
CALCIUM: 8.7 mg/dL (ref 8.4–10.5)
CO2: 26 meq/L (ref 19–32)
CREATININE: 1.03 mg/dL (ref 0.50–1.35)
Chloride: 107 mEq/L (ref 96–112)
GLUCOSE: 81 mg/dL (ref 70–99)
Potassium: 4.4 mEq/L (ref 3.5–5.3)
Sodium: 143 mEq/L (ref 135–145)

## 2015-01-24 LAB — LIPID PANEL
CHOL/HDL RATIO: 4.2 ratio
Cholesterol: 146 mg/dL (ref 0–200)
HDL: 35 mg/dL — AB (ref >=40)
LDL CALC: 87 mg/dL (ref 0–99)
Triglycerides: 121 mg/dL (ref <150)
VLDL: 24 mg/dL (ref 0–40)

## 2015-01-24 NOTE — Telephone Encounter (Signed)
-----   Message from Pieter Partridge, DO sent at 01/24/2015  6:51 AM EDT ----- Lipid panel looks okay.  HDL is mildly low (should be a little higher).  Suggest increase exercise if possible. ----- Message -----    From: Lab in [facility] Interface    Sent: 01/24/2015   6:35 AM      To: Pieter Partridge, DO

## 2015-01-24 NOTE — Telephone Encounter (Signed)
Patient notified of results & advisement.

## 2015-02-06 ENCOUNTER — Other Ambulatory Visit (HOSPITAL_COMMUNITY): Payer: Self-pay

## 2015-02-06 ENCOUNTER — Inpatient Hospital Stay
Admission: RE | Admit: 2015-02-06 | Discharge: 2015-02-06 | Disposition: A | Discharge status: Home / Self Care | Payer: Self-pay | Source: Ambulatory Visit | Attending: *Deleted | Admitting: *Deleted

## 2015-02-06 ENCOUNTER — Ambulatory Visit (HOSPITAL_COMMUNITY)
Admission: RE | Admit: 2015-02-06 | Discharge: 2015-02-06 | Disposition: A | Discharge status: Home / Self Care | Payer: Medicare Other | Source: Ambulatory Visit | Attending: Neurology | Admitting: Neurology

## 2015-02-06 DIAGNOSIS — Z8673 Personal history of transient ischemic attack (TIA), and cerebral infarction without residual deficits: Secondary | ICD-10-CM | POA: Diagnosis not present

## 2015-02-06 DIAGNOSIS — R52 Pain, unspecified: Secondary | ICD-10-CM

## 2015-02-06 DIAGNOSIS — I679 Cerebrovascular disease, unspecified: Secondary | ICD-10-CM

## 2015-02-06 DIAGNOSIS — R4189 Other symptoms and signs involving cognitive functions and awareness: Secondary | ICD-10-CM | POA: Diagnosis not present

## 2015-02-10 ENCOUNTER — Other Ambulatory Visit: Payer: Self-pay | Admitting: Neurology

## 2015-02-10 ENCOUNTER — Telehealth: Payer: Self-pay | Admitting: Neurology

## 2015-02-10 MED ORDER — DONEPEZIL HCL 5 MG PO TABS: 5.0000 mg | ORAL_TABLET | Freq: Every day | ORAL | Status: DC

## 2015-02-10 NOTE — Telephone Encounter (Signed)
PT wife Jana Half called and  states that she was returning Dr Tomi Likens call about MRI results please call [phone] or [phone]

## 2015-02-10 NOTE — Telephone Encounter (Signed)
See below  Note

## 2015-02-10 NOTE — Telephone Encounter (Signed)
See below

## 2015-02-10 NOTE — Telephone Encounter (Signed)
I spoke with Eric Pratt wife regarding MRI results.  The MRI of the brain looks stable.  I would like to start him on Aricept 5mg  at bedtime.  Side effects discussed.  They will call us at the end of the month and if tolerating, will increase to 10mg  at bedtime.  Prescription sent to [facility]

## 2015-02-11 ENCOUNTER — Telehealth: Payer: Self-pay | Admitting: *Deleted

## 2015-02-11 NOTE — Telephone Encounter (Signed)
-----   Message from Pieter Partridge, DO sent at 02/10/2015  1:03 PM EDT ----- On Friday, I left a message for patient to call back.  MRI of the brain is stable.  I was wondering if he would want to try Aricept to help with memory. ----- Message -----    From: Rad Results In Interface    Sent: 02/06/2015   4:08 PM      To: Pieter Partridge, DO

## 2015-02-11 NOTE — Telephone Encounter (Signed)
Dr Tomi Likens spoke with Jana Half spouse of patient and they were able to get medication yesterday  Will call in 4 weeks with up date

## 2015-03-11 ENCOUNTER — Telehealth: Payer: Self-pay | Admitting: Neurology

## 2015-03-11 ENCOUNTER — Other Ambulatory Visit: Payer: Self-pay | Admitting: *Deleted

## 2015-03-11 MED ORDER — DONEPEZIL HCL 10 MG PO TABS: 10.0000 mg | ORAL_TABLET | Freq: Every day | ORAL | Status: DC

## 2015-03-11 NOTE — Telephone Encounter (Signed)
Increase Aricept to 10mg  at bedtime

## 2015-03-11 NOTE — Telephone Encounter (Signed)
Pt wife Jana Half called and states that she need to talk to someone about medication dosage change. She states that he is still very confused please call [phone] medication is aricept

## 2015-03-11 NOTE — Telephone Encounter (Signed)
Patient's wife called stating that patient has tolerated the aricept 5 mg ok he had some rolling stomach at first but that is now better . Please advise if you would like to increase

## 2015-04-07 ENCOUNTER — Telehealth: Payer: Self-pay | Admitting: Neurology

## 2015-04-07 NOTE — Telephone Encounter (Signed)
Spouse is aware that patient has 3 refills on his Aricept waiting at pharmacy

## 2015-04-07 NOTE — Telephone Encounter (Signed)
pts wife Jana Half called in regards to getting his medication Aricept refilled/Dawn CB# [phone]

## 2015-04-24 ENCOUNTER — Ambulatory Visit (INDEPENDENT_AMBULATORY_CARE_PROVIDER_SITE_OTHER): Payer: Medicare Other | Admitting: Neurology

## 2015-04-24 ENCOUNTER — Encounter: Payer: Self-pay | Admitting: Neurology

## 2015-04-24 VITALS — BP 118/70 | HR 66 | Resp 18 | Ht 72.0 in | Wt 188.7 lb

## 2015-04-24 DIAGNOSIS — I679 Cerebrovascular disease, unspecified: Secondary | ICD-10-CM | POA: Diagnosis not present

## 2015-04-24 DIAGNOSIS — G3184 Mild cognitive impairment, so stated: Secondary | ICD-10-CM | POA: Diagnosis not present

## 2015-04-24 NOTE — Patient Instructions (Signed)
Continue aricept 10mg  daily.  Consider starting Namenda Follow up in 3 months and we will repeat memory testing.

## 2015-04-24 NOTE — Procedures (Signed)
ELECTROENCEPHALOGRAM REPORT  Date of Study: 01/23/2015  Patient's Name: Eric Pratt MRN: [id number] Date of Birth: [birth year]  Indication: History of TIAs.  Recurrent episodes of confusion.  Medications: Aricept  Technical Summary: This is a multichannel digital EEG recording, using the international 10-20 placement system.  Spike detection software was employed.  Description: The EEG background is symmetric, with a well-developed posterior dominant rhythm of 8 Hz, which is reactive to eye opening and closing.  Diffuse beta activity is seen, with a bilateral frontal preponderance.  No focal or generalized abnormalities are seen.  No focal or generalized epileptiform discharges are seen.  Stage II sleep is not seen.  Hyperventilation and photic stimulation were performed, and produced no abnormalities.  ECG revealed normal cardiac rate and rhythm.  Impression: This is a normal routine EEG of the awake and drowsy states, with activating procedures.  A normal study does not rule out the possibility of a seizure disorder in this patient.  [name] R. Tomi Likens, DO

## 2015-04-24 NOTE — Progress Notes (Signed)
NEUROLOGY FOLLOW UP OFFICE NOTE  Eric Pratt [id number]  HISTORY OF PRESENT ILLNESS: Eric Pratt is a 80 year old right-handed man with history of hypertension, hypercholesterolemia, GERD, and former smoker who follows up for cerebrovascular disease and memory problems.  He is accompanied by his wife, who provides some history.  UPDATE: EEG performed on 01/23/15 was normal.  Labs reviewed and LDL was 87.  Images of MRI of brain without contrast performed on 02/06/15 were reviewed and showed stable chronic small vessel disease but nothing acute.  He was started on Aricept and is taking 10mg  daily.  In May, he had another episode of "confusion".  Again, it occurred after he awoke from a nap.  He was oriented but he just seemed off.  His wife cannot really elaborate.  He had a glazed look in his eyes and was not really carrying on a conversation easily.  It improved by the evening.  Overall, he is doing well.  He still drives without difficulty.  HISTORY: In late 2014, he had an episode of confusion.  He was driving a friend home, when suddenly he stopped off and parked in the [employer] parking lot.  His goddaughter happened to be walking out and stopped by.  He does not remember this.  He then drove home, instead of dropping off his friend, and walked inside the house, leaving his friend in the car.  A few minutes later, his friend came to the door.  Again, he had no recollection of this.  Later that night, he complained of right shoulder pain and he went to the ED.  They waited for over 2 hours and he was back to normal, so they decided to leave.  They saw their PCP the next day.  He was having upper respiratory symptoms.  His WBC was 13.4.  CT of the chest revealed pulmonary fibrosis and pneumonia.  He was started on Levoquin.  His blood pressure was 90/60.  Amlodipine was discontinued.  An MRI of the brain was ordered in January 2015 and revealed significant chronic small vessel disease  and lacunar infarcts.  He underwent a stroke workup in February 2015.  2D echo showed EF 55-60%.  Carotid doppler showed 1-39% bilateral ICA stenosis.  At the time, he had a B12 of 306, so a methylmalonic acid level was checked, which was normal.  He was advised to take ASA 81mg  daily.  In April 2016, he took a nap prior to the [place] game.  When he woke up, he seemed a little confused and wasn't acting right.  During the game, friends came over.  He sat there without saying a word.  He was lethargic and withdrawn.  There was no slurred speech or facial droop.  He was a little better the following morning but didn't really improve until later in the day.  His gait seemed a little slow too.  He had a left knee replacement 6 years ago and had some trouble with memory for the next couple of years.  It was attributed to effect to anesthesia.  He files his own taxes without problems, except last year he did forget a couple of things which they took care of this year.  He drives without difficulty.  He has forgotten how to start the car.  One time, he went into the pharmacy.  When he got out, he had realized that he left the key in the ignition and the battery was dead.  He used  to handle all the finances.  He will still write the checks but he no longer can keep up with it and forgets to pay some bills.  He does well otherwise, such as going to a CE course for pharmacists.  His wife thinks this has been a gradual decline.  His father had dementia.  He is not on blood thinners.  He said he took an aspirin once several years ago at the beach and got a rash.  However, it could have been a reaction to sunscreen.  He is a retired Software engineer.  PAST MEDICAL HISTORY: Past Medical History  Diagnosis Date  . Inguinal hernia   . Hemorrhoids   . Colon polyps   . Allergic rhinitis   . Hypertension   . Arthritis     hands  . H. pylori infection 2001  . Skin cancer     squamous cell  . Hypercholesterolemia   .  GERD (gastroesophageal reflux disease)   . Ischemia     small vessel/lacunar infarction  . Pneumonia     MEDICATIONS: Current Outpatient Prescriptions on File Prior to Visit  Medication Sig Dispense Refill  . amoxicillin-clavulanate (AUGMENTIN) 875-125 MG per tablet     . omeprazole (PRILOSEC) 20 MG capsule Take 20 mg by mouth daily.    Marland Kitchen aspirin EC 81 MG tablet Take 81 mg by mouth daily.    Marland Kitchen donepezil (ARICEPT) 10 MG tablet Take 1 tablet (10 mg total) by mouth at bedtime. 30 tablet 3  . donepezil (ARICEPT) 5 MG tablet Take 1 tablet (5 mg total) by mouth at bedtime. (Patient not taking: Reported on 04/24/2015) 30 tablet 0   No current facility-administered medications on file prior to visit.    ALLERGIES: Allergies  Allergen Reactions  . Bactrim Ds [Sulfamethoxazole-Trimethoprim] Rash    FAMILY HISTORY: Family History  Problem Relation Age of Onset  . Colon cancer Brother   . Prostate cancer Brother   . Melanoma Mother   . Breast cancer Mother   . Heart failure Mother   . Heart failure Brother     SOCIAL HISTORY: History   Social History  . Marital Status: Married    Spouse Name: N/A  . Number of Children: N/A  . Years of Education: N/A   Occupational History  . Not on file.   Social History Main Topics  . Smoking status: Former Research scientist (life sciences)  . Smokeless tobacco: Former Systems developer    Types: Chew     Comment: quit 1985  . Alcohol Use: 0.6 oz/week    1 Standard drinks or equivalent per week  . Drug Use: No  . Sexual Activity: No   Other Topics Concern  . Not on file   Social History Narrative    REVIEW OF SYSTEMS: Constitutional: No fevers, chills, or sweats, no generalized fatigue, change in appetite Eyes: No visual changes, double vision, eye pain Ear, nose and throat: No hearing loss, ear pain, nasal congestion, sore throat Cardiovascular: No chest pain, palpitations Respiratory:  No shortness of breath at rest or with exertion, wheezes GastrointestinaI: No  nausea, vomiting, diarrhea, abdominal pain, fecal incontinence Genitourinary:  No dysuria, urinary retention or frequency Musculoskeletal:  No neck pain, back pain Integumentary: No rash, pruritus, skin lesions Neurological: as above Psychiatric: No depression, insomnia, anxiety Endocrine: No palpitations, fatigue, diaphoresis, mood swings, change in appetite, change in weight, increased thirst Hematologic/Lymphatic:  No anemia, purpura, petechiae. Allergic/Immunologic: no itchy/runny eyes, nasal congestion, recent allergic reactions, rashes  PHYSICAL EXAM: Filed  Vitals:   04/24/15 0939  BP: 118/70  Pulse: 66  Resp: 18   General: No acute distress.  Patient appears well-groomed.  normal body habitus. Head:  Normocephalic/atraumatic Eyes:  Fundoscopic exam unremarkable without vessel changes, exudates, hemorrhages or papilledema. Neck: supple, no paraspinal tenderness, full range of motion Heart:  Regular rate and rhythm Lungs:  Clear to auscultation bilaterally Back: No paraspinal tenderness Neurological Exam: alert and oriented to person, place, and time. Attention span and concentration intact, recalled 1 of 3 words after 5 minutes, remote memory intact, fund of knowledge intact.  Speech fluent and not dysarthric, language intact.  CN II-XII intact. Fundoscopic exam unremarkable without vessel changes, exudates, hemorrhages or papilledema.  Bulk and tone normal, muscle strength 5/5 throughout.  Sensation to light touch, temperature and vibration intact.  Deep tendon reflexes 2+ throughout, toes downgoing.  Finger to nose and heel to shin testing intact.  Gait normal, Romberg negative.  IMPRESSION: 1.  Recurrent episodes of confusion with gradual cognitive decline.  I suspect the episodes of confusion are symptom of cognitive impairment rather than recurrent TIAs, since there is no clear focal symptoms, they occur after a nap and are habitual. 2.  Cerebrovascular disease 3.  Mild  cognitive impairment with memory loss.  His deficits are primarily short-term memory.  I think there may be an underlying neurodegenerative process (Alzheimer's) go on as well as vascular.  I suggested starting Namenda in addition to Aricept.  He would like to wait for now.  PLAN: 1.  Continue Aricept 10mg  daily 2.  Continue ASA 81mg  daily 3.  At this point, I think he can continue driving.  I would recommend limiting it to daylight hours. 4.  Follow up in 3 months and recheck MOCA    Metta Clines, DO  CC:  Jerene Bears, MD

## 2015-07-23 ENCOUNTER — Encounter: Payer: Self-pay | Admitting: Neurology

## 2015-07-23 ENCOUNTER — Ambulatory Visit (INDEPENDENT_AMBULATORY_CARE_PROVIDER_SITE_OTHER): Payer: Medicare Other | Admitting: Neurology

## 2015-07-23 VITALS — BP 122/80 | HR 66 | Ht 72.0 in | Wt 191.1 lb

## 2015-07-23 DIAGNOSIS — G3184 Mild cognitive impairment, so stated: Secondary | ICD-10-CM

## 2015-07-23 DIAGNOSIS — I679 Cerebrovascular disease, unspecified: Secondary | ICD-10-CM

## 2015-07-23 DIAGNOSIS — I10 Essential (primary) hypertension: Secondary | ICD-10-CM

## 2015-07-23 NOTE — Progress Notes (Signed)
NEUROLOGY FOLLOW UP OFFICE NOTE  [name] [id number]  HISTORY OF PRESENT ILLNESS: Eric Pratt is a 80 year old right-handed man with history of hypertension, hypercholesterolemia, GERD, and former smoker who follows up for cerebrovascular disease and memory problems.  He is accompanied by his wife, who provides some history.  UPDATE: He reports forgetting about errands.  He forgets conversations or recent events.  He is independent.  He drives only locally without problems.  Mood is good.  He has insight to his memory problems.  HISTORY: In late 2014, he had an episode of confusion.  He was driving a friend home, when suddenly he stopped off and parked in the [employer] parking lot.  His goddaughter happened to be walking out and stopped by.  He does not remember this.  He then drove home, instead of dropping off his friend, and walked inside the house, leaving his friend in the car.  A few minutes later, his friend came to the door.  Again, he had no recollection of this.  Later that night, he complained of right shoulder pain and he went to the ED.  They waited for over 2 hours and he was back to normal, so they decided to leave.  They saw their PCP the next day.  He was having upper respiratory symptoms.  His WBC was 13.4.  CT of the chest revealed pulmonary fibrosis and pneumonia.  He was started on Levoquin.  His blood pressure was 90/60.  Amlodipine was discontinued.  An MRI of the brain was ordered in January 2015 and revealed significant chronic small vessel disease and lacunar infarcts.  He underwent a stroke workup in February 2015.  2D echo showed EF 55-60%.  Carotid doppler showed 1-39% bilateral ICA stenosis.  At the time, he had a B12 of 306, so a methylmalonic acid level was checked, which was normal.  He was advised to take ASA 81mg  daily.  In April 2016, he took a nap prior to the [place] game.  When he woke up, he seemed a little confused and wasn't acting right.   During the game, friends came over.  He sat there without saying a word.  He was lethargic and withdrawn.  There was no slurred speech or facial droop.  He was a little better the following morning but didn't really improve until later in the day.  His gait seemed a little slow too.  In May 2016, he had another episode of "confusion".  Again, it occurred after he awoke from a nap.  He was oriented but he just seemed off.  His wife cannot really elaborate.  He had a glazed look in his eyes and was not really carrying on a conversation easily.  It improved by the evening.    He had a left knee replacement 6 years ago and had some trouble with memory for the next couple of years.  It was attributed to effect to anesthesia.  He files his own taxes without problems, except last year he did forget a couple of things which they took care of this year.  He drives without difficulty.  He has forgotten how to start the car.  One time, he went into the pharmacy.  When he got out, he had realized that he left the key in the ignition and the battery was dead.  He used to handle all the finances.  He will still write the checks but he no longer can keep up with it  and forgets to pay some bills.  He does well otherwise, such as going to a CE course for pharmacists.  His wife thinks this has been a gradual decline.  His father had dementia.  He is not on blood thinners.  He said he took an aspirin once several years ago at the beach and got a rash.  However, it could have been a reaction to sunscreen.  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  EEG performed on 01/23/15 was normal.  Labs reviewed and LDL was 87.  Images of MRI of brain without contrast performed on 02/06/15 were reviewed and showed stable chronic small vessel disease but nothing acute.    PAST MEDICAL HISTORY: Past Medical History  Diagnosis Date  . Inguinal hernia   . Hemorrhoids   . Colon polyps   . Allergic rhinitis   . Hypertension    . Arthritis     hands  . H. pylori infection 2001  . Skin cancer     squamous cell  . Hypercholesterolemia   . GERD (gastroesophageal reflux disease)   . Ischemia     small vessel/lacunar infarction  . Pneumonia     MEDICATIONS: Current Outpatient Prescriptions on File Prior to Visit  Medication Sig Dispense Refill  . amoxicillin-clavulanate (AUGMENTIN) 875-125 MG per tablet     . aspirin EC 81 MG tablet Take 81 mg by mouth daily.    Marland Kitchen donepezil (ARICEPT) 10 MG tablet Take 1 tablet (10 mg total) by mouth at bedtime. 30 tablet 3  . donepezil (ARICEPT) 5 MG tablet Take 1 tablet (5 mg total) by mouth at bedtime. 30 tablet 0  . levofloxacin (LEVAQUIN) 500 MG tablet     . omeprazole (PRILOSEC) 20 MG capsule Take 20 mg by mouth daily.    . predniSONE (STERAPRED UNI-PAK 21 TAB) 5 MG (21) TBPK tablet     . SYMBICORT 160-4.5 MCG/ACT inhaler      No current facility-administered medications on file prior to visit.    ALLERGIES: Allergies  Allergen Reactions  . Bactrim Ds [Sulfamethoxazole-Trimethoprim] Rash    FAMILY HISTORY: Family History  Problem Relation Age of Onset  . Colon cancer Brother   . Prostate cancer Brother   . Melanoma Mother   . Breast cancer Mother   . Heart failure Mother   . Heart failure Brother     SOCIAL HISTORY: Social History   Social History  . Marital Status: Married    Spouse Name: N/A  . Number of Children: N/A  . Years of Education: N/A   Occupational History  . Not on file.   Social History Main Topics  . Smoking status: Former Research scientist (life sciences)  . Smokeless tobacco: Former Systems developer    Types: Chew     Comment: quit 1985  . Alcohol Use: 0.6 oz/week    1 Standard drinks or equivalent per week  . Drug Use: No  . Sexual Activity: No   Other Topics Concern  . Not on file   Social History Narrative    REVIEW OF SYSTEMS: Constitutional: No fevers, chills, or sweats, no generalized fatigue, change in appetite Eyes: No visual changes, double  vision, eye pain Ear, nose and throat: No hearing loss, ear pain, nasal congestion, sore throat Cardiovascular: No chest pain, palpitations Respiratory:  No shortness of breath at rest or with exertion, wheezes GastrointestinaI: No nausea, vomiting, diarrhea, abdominal pain, fecal incontinence Genitourinary:  No dysuria, urinary retention or frequency Musculoskeletal:  No  neck pain, back pain Integumentary: No rash, pruritus, skin lesions Neurological: as above Psychiatric: No depression, insomnia, anxiety Endocrine: No palpitations, fatigue, diaphoresis, mood swings, change in appetite, change in weight, increased thirst Hematologic/Lymphatic:  No anemia, purpura, petechiae. Allergic/Immunologic: no itchy/runny eyes, nasal congestion, recent allergic reactions, rashes  PHYSICAL EXAM: Filed Vitals:   07/23/15 1034  BP: 122/80  Pulse: 66   General: No acute distress.  Patient appears well-groomed. . Head:  Normocephalic/atraumatic Eyes:  Fundoscopic exam unremarkable without vessel changes, exudates, hemorrhages or papilledema. Neck: supple, no paraspinal tenderness, full range of motion Heart:  Regular rate and rhythm Lungs:  Clear to auscultation bilaterally Back: No paraspinal tenderness Neurological Exam: alert and oriented to person, place, and time. Attention span and concentration intact, recent memory poor, remote memory intact, fund of knowledge intact.  Speech fluent and not dysarthric, language intact.   Montreal Cognitive Assessment  07/23/2015 01/17/2015  Visuospatial/ Executive (0/5) 4 4  Naming (0/3) 3 3  Attention: Read list of digits (0/2) 2 1  Attention: Read list of letters (0/1) 1 1  Attention: Serial 7 subtraction starting at 100 (0/3) 3 3  Language: Repeat phrase (0/2) 2 1  Language : Fluency (0/1) 0 0  Abstraction (0/2) 2 2  Delayed Recall (0/5) 0 1  Orientation (0/6) 5 5  Total 22 21  Adjusted Score (based on education) 22 21   CN II-XII intact.  Fundoscopic exam unremarkable without vessel changes, exudates, hemorrhages or papilledema.  Bulk and tone normal, muscle strength 5/5 throughout.  Sensation to light touch, temperature and vibration intact.  Deep tendon reflexes 2+ throughout, toes downgoing.  Finger to nose and heel to shin testing intact.  Gait normal  IMPRESSION: 1.  Recurrent episodes of confusion with gradual cognitive decline.  I suspect the episodes of confusion are symptom of cognitive impairment rather than recurrent TIAs, since there is no clear focal symptoms, they occur after a nap and are habitual. 2.  Cerebrovascular disease 3.  Mild cognitive impairment with memory loss.  His deficits are primarily short-term memory.  I think there may be an underlying neurodegenerative process (Alzheimer's) go on as well as vascular  PLAN: Continue aspirin daily Continue Aricept 10mg  daily Mediterranean diet Limit driving to daylight hours and around town. Follow up in 6 months.  26 minutes spent face to face with patient, over 50% spent discussing diagnosis and management.   Metta Clines, DO  CC:  Jerene Bears, MD

## 2015-07-23 NOTE — Patient Instructions (Addendum)
Continue aspirin daily Continue Aricept 10mg  daily Mediterranean diet Limit driving to daylight hours and around town. Follow up in 6 months.      Why follow it? Research shows. . Those who follow the Mediterranean diet have a reduced risk of heart disease  . The diet is associated with a reduced incidence of Parkinson's and Alzheimer's diseases . People following the diet may have longer life expectancies and lower rates of chronic diseases  . The Dietary Guidelines for Americans recommends the Mediterranean diet as an eating plan to promote health and prevent disease  What Is the Mediterranean Diet?  . Healthy eating plan based on typical foods and recipes of Mediterranean-style cooking . The diet is primarily a plant based diet; these foods should make up a majority of meals   Starches - Plant based foods should make up a majority of meals - They are an important sources of vitamins, minerals, energy, antioxidants, and fiber - Choose whole grains, foods high in fiber and minimally processed items  - Typical grain sources include wheat, oats, barley, corn, brown rice, bulgar, farro, millet, polenta, couscous  - Various types of beans include chickpeas, lentils, fava beans, black beans, white beans   Fruits  Veggies - Large quantities of antioxidant rich fruits & veggies; 6 or more servings  - Vegetables can be eaten raw or lightly drizzled with oil and cooked  - Vegetables common to the traditional Mediterranean Diet include: artichokes, arugula, beets, broccoli, brussel sprouts, cabbage, carrots, celery, collard greens, cucumbers, eggplant, kale, leeks, lemons, lettuce, mushrooms, okra, onions, peas, peppers, potatoes, pumpkin, radishes, rutabaga, shallots, spinach, sweet potatoes, turnips, zucchini - Fruits common to the Mediterranean Diet include: apples, apricots, avocados, cherries, clementines, dates, figs, grapefruits, grapes, melons, nectarines, oranges, peaches, pears,  pomegranates, strawberries, tangerines  Fats - Replace butter and margarine with healthy oils, such as olive oil, canola oil, and tahini  - Limit nuts to no more than a handful a day  - Nuts include walnuts, almonds, pecans, pistachios, pine nuts  - Limit or avoid candied, honey roasted or heavily salted nuts - Olives are central to the [employer] - can be eaten whole or used in a variety of dishes   Meats Protein - Limiting red meat: no more than a few times a month - When eating red meat: choose lean cuts and keep the portion to the size of deck of cards - Eggs: approx. 0 to 4 times a week  - Fish and lean poultry: at least 2 a week  - Healthy protein sources include, chicken, [place], lean beef, lamb - Increase intake of seafood such as tuna, salmon, trout, mackerel, shrimp, scallops - Avoid or limit high fat processed meats such as sausage and bacon  Dairy - Include moderate amounts of low fat dairy products  - Focus on healthy dairy such as fat free yogurt, skim milk, low or reduced fat cheese - Limit dairy products higher in fat such as whole or 2% milk, cheese, ice cream  Alcohol - Moderate amounts of red wine is ok  - No more than 5 oz daily for women (all ages) and men older than age 63  - No more than 10 oz of wine daily for men younger than 11  Other - Limit sweets and other desserts  - Use herbs and spices instead of salt to flavor foods  - Herbs and spices common to the traditional Mediterranean Diet include: basil, bay leaves, chives, cloves, cumin, fennel, garlic, lavender,  marjoram, mint, oregano, parsley, pepper, rosemary, sage, savory, sumac, tarragon, thyme   It's not just a diet, it's a lifestyle:  . The Mediterranean diet includes lifestyle factors typical of those in the region  . Foods, drinks and meals are best eaten with others and savored . Daily physical activity is important for overall good health . This could be strenuous exercise like running and  aerobics . This could also be more leisurely activities such as walking, housework, yard-work, or taking the stairs . Moderation is the key; a balanced and healthy diet accommodates most foods and drinks . Consider portion sizes and frequency of consumption of certain foods   Meal Ideas & Options:  . Breakfast:  o Whole wheat toast or whole wheat English muffins with peanut butter & hard boiled egg o Steel cut oats topped with apples & cinnamon and skim milk  o Fresh fruit: banana, strawberries, melon, berries, peaches  o Smoothies: strawberries, bananas, greek yogurt, peanut butter o Low fat greek yogurt with blueberries and granola  o Egg white omelet with spinach and mushrooms o Breakfast couscous: whole wheat couscous, apricots, skim milk, cranberries  . Sandwiches:  o Hummus and grilled vegetables (peppers, zucchini, squash) on whole wheat bread   o Grilled chicken on whole wheat pita with lettuce, tomatoes, cucumbers or tzatziki  o Tuna salad on whole wheat bread: tuna salad made with greek yogurt, olives, red peppers, capers, green onions o Garlic rosemary lamb pita: lamb sauted with garlic, rosemary, salt & pepper; add lettuce, cucumber, greek yogurt to pita - flavor with lemon juice and black pepper  . Seafood:  o Mediterranean grilled salmon, seasoned with garlic, basil, parsley, lemon juice and black pepper o Shrimp, lemon, and spinach whole-grain pasta salad made with low fat greek yogurt  o Seared scallops with lemon orzo  o Seared tuna steaks seasoned salt, pepper, coriander topped with tomato mixture of olives, tomatoes, olive oil, minced garlic, parsley, green onions and cappers  . Meats:  o Herbed greek chicken salad with kalamata olives, cucumber, feta  o Red bell peppers stuffed with spinach, bulgur, lean ground beef (or lentils) & topped with feta   o Kebabs: skewers of chicken, tomatoes, onions, zucchini, squash  o [place] burgers: made with red onions, mint, dill,  lemon juice, feta cheese topped with roasted red peppers . Vegetarian o Cucumber salad: cucumbers, artichoke hearts, celery, red onion, feta cheese, tossed in olive oil & lemon juice  o Hummus and whole grain pita points with a greek salad (lettuce, tomato, feta, olives, cucumbers, red onion) o Lentil soup with celery, carrots made with vegetable broth, garlic, salt and pepper  o Tabouli salad: parsley, bulgur, mint, scallions, cucumbers, tomato, radishes, lemon juice, olive oil, salt and pepper.

## 2015-08-15 ENCOUNTER — Ambulatory Visit: Payer: Medicare Other | Admitting: Neurology

## 2015-11-16 ENCOUNTER — Other Ambulatory Visit: Payer: Self-pay | Admitting: Neurology

## 2015-11-17 ENCOUNTER — Other Ambulatory Visit: Payer: Self-pay

## 2015-11-17 MED ORDER — DONEPEZIL HCL 10 MG PO TABS: 10.0000 mg | ORAL_TABLET | Freq: Every day | ORAL | Status: DC

## 2015-11-17 NOTE — Telephone Encounter (Signed)
Last OV: 07/23/15 Next OV: 01/13/16 Continue Aricept 10mg  daily

## 2016-01-13 ENCOUNTER — Encounter: Payer: Self-pay | Admitting: Neurology

## 2016-01-13 ENCOUNTER — Ambulatory Visit (INDEPENDENT_AMBULATORY_CARE_PROVIDER_SITE_OTHER): Payer: Medicare Other | Admitting: Neurology

## 2016-01-13 VITALS — BP 118/80 | HR 76 | Ht 72.0 in | Wt 194.0 lb

## 2016-01-13 DIAGNOSIS — F0150 Vascular dementia without behavioral disturbance: Secondary | ICD-10-CM | POA: Diagnosis not present

## 2016-01-13 DIAGNOSIS — I10 Essential (primary) hypertension: Secondary | ICD-10-CM

## 2016-01-13 MED ORDER — DONEPEZIL HCL 10 MG PO TABS: 10.0000 mg | ORAL_TABLET | Freq: Every day | ORAL | Status: DC

## 2016-01-13 NOTE — Patient Instructions (Signed)
1.  Continue aspirin daily 2.  Continue Aricept 10mg  at bedtime. 3.  I think you should be on a statin.  Recommend that LDL be less than 70.  This can be handled by Dr. Woody Seller 4.  Check carotid doppler 5.  Mediterranean diet    Why follow it? Research shows. . Those who follow the Mediterranean diet have a reduced risk of heart disease  . The diet is associated with a reduced incidence of Parkinson's and Alzheimer's diseases . People following the diet may have longer life expectancies and lower rates of chronic diseases  . The Dietary Guidelines for Americans recommends the Mediterranean diet as an eating plan to promote health and prevent disease  What Is the Mediterranean Diet?  . Healthy eating plan based on typical foods and recipes of Mediterranean-style cooking . The diet is primarily a plant based diet; these foods should make up a majority of meals   Starches - Plant based foods should make up a majority of meals - They are an important sources of vitamins, minerals, energy, antioxidants, and fiber - Choose whole grains, foods high in fiber and minimally processed items  - Typical grain sources include wheat, oats, barley, corn, brown rice, bulgar, farro, millet, polenta, couscous  - Various types of beans include chickpeas, lentils, fava beans, black beans, white beans   Fruits  Veggies - Large quantities of antioxidant rich fruits & veggies; 6 or more servings  - Vegetables can be eaten raw or lightly drizzled with oil and cooked  - Vegetables common to the traditional Mediterranean Diet include: artichokes, arugula, beets, broccoli, brussel sprouts, cabbage, carrots, celery, collard greens, cucumbers, eggplant, kale, leeks, lemons, lettuce, mushrooms, okra, onions, peas, peppers, potatoes, pumpkin, radishes, rutabaga, shallots, spinach, sweet potatoes, turnips, zucchini - Fruits common to the Mediterranean Diet include: apples, apricots, avocados, cherries, clementines, dates, figs,  grapefruits, grapes, melons, nectarines, oranges, peaches, pears, pomegranates, strawberries, tangerines  Fats - Replace butter and margarine with healthy oils, such as olive oil, canola oil, and tahini  - Limit nuts to no more than a handful a day  - Nuts include walnuts, almonds, pecans, pistachios, pine nuts  - Limit or avoid candied, honey roasted or heavily salted nuts - Olives are central to the [employer] - can be eaten whole or used in a variety of dishes   Meats Protein - Limiting red meat: no more than a few times a month - When eating red meat: choose lean cuts and keep the portion to the size of deck of cards - Eggs: approx. 0 to 4 times a week  - Fish and lean poultry: at least 2 a week  - Healthy protein sources include, chicken, [place], lean beef, lamb - Increase intake of seafood such as tuna, salmon, trout, mackerel, shrimp, scallops - Avoid or limit high fat processed meats such as sausage and bacon  Dairy - Include moderate amounts of low fat dairy products  - Focus on healthy dairy such as fat free yogurt, skim milk, low or reduced fat cheese - Limit dairy products higher in fat such as whole or 2% milk, cheese, ice cream  Alcohol - Moderate amounts of red wine is ok  - No more than 5 oz daily for women (all ages) and men older than age 17  - No more than 10 oz of wine daily for men younger than 57  Other - Limit sweets and other desserts  - Use herbs and spices instead of salt to  flavor foods  - Herbs and spices common to the traditional Mediterranean Diet include: basil, bay leaves, chives, cloves, cumin, fennel, garlic, lavender, marjoram, mint, oregano, parsley, pepper, rosemary, sage, savory, sumac, tarragon, thyme   It's not just a diet, it's a lifestyle:  . The Mediterranean diet includes lifestyle factors typical of those in the region  . Foods, drinks and meals are best eaten with others and savored . Daily physical activity is important for overall good  health . This could be strenuous exercise like running and aerobics . This could also be more leisurely activities such as walking, housework, yard-work, or taking the stairs . Moderation is the key; a balanced and healthy diet accommodates most foods and drinks . Consider portion sizes and frequency of consumption of certain foods   Meal Ideas & Options:  . Breakfast:  o Whole wheat toast or whole wheat English muffins with peanut butter & hard boiled egg o Steel cut oats topped with apples & cinnamon and skim milk  o Fresh fruit: banana, strawberries, melon, berries, peaches  o Smoothies: strawberries, bananas, greek yogurt, peanut butter o Low fat greek yogurt with blueberries and granola  o Egg white omelet with spinach and mushrooms o Breakfast couscous: whole wheat couscous, apricots, skim milk, cranberries  . Sandwiches:  o Hummus and grilled vegetables (peppers, zucchini, squash) on whole wheat bread   o Grilled chicken on whole wheat pita with lettuce, tomatoes, cucumbers or tzatziki  o Tuna salad on whole wheat bread: tuna salad made with greek yogurt, olives, red peppers, capers, green onions o Garlic rosemary lamb pita: lamb sauted with garlic, rosemary, salt & pepper; add lettuce, cucumber, greek yogurt to pita - flavor with lemon juice and black pepper  . Seafood:  o Mediterranean grilled salmon, seasoned with garlic, basil, parsley, lemon juice and black pepper o Shrimp, lemon, and spinach whole-grain pasta salad made with low fat greek yogurt  o Seared scallops with lemon orzo  o Seared tuna steaks seasoned salt, pepper, coriander topped with tomato mixture of olives, tomatoes, olive oil, minced garlic, parsley, green onions and cappers  . Meats:  o Herbed greek chicken salad with kalamata olives, cucumber, feta  o Red bell peppers stuffed with spinach, bulgur, lean ground beef (or lentils) & topped with feta   o Kebabs: skewers of chicken, tomatoes, onions, zucchini,  squash  o [place] burgers: made with red onions, mint, dill, lemon juice, feta cheese topped with roasted red peppers . Vegetarian o Cucumber salad: cucumbers, artichoke hearts, celery, red onion, feta cheese, tossed in olive oil & lemon juice  o Hummus and whole grain pita points with a greek salad (lettuce, tomato, feta, olives, cucumbers, red onion) o Lentil soup with celery, carrots made with vegetable broth, garlic, salt and pepper  o Tabouli salad: parsley, bulgur, mint, scallions, cucumbers, tomato, radishes, lemon juice, olive oil, salt and pepper. 6.  Limit driving to daylight hours locally around town. 7.  Follow up in 6 months.

## 2016-01-13 NOTE — Progress Notes (Signed)
Chart forwarded.  

## 2016-01-13 NOTE — Progress Notes (Signed)
NEUROLOGY FOLLOW UP OFFICE NOTE  OBADIAH SICONOLFI [id number]  HISTORY OF PRESENT ILLNESS: Eric Pratt is a 81 year old right-handed man with hypertension, hypercholesterolemia, GERD, and former smoker who follows up for cerebrovascular disease and memory problems.  He is accompanied by his wife, who provides some history.  UPDATE: There has been no change or worsening in memory or other aspect of cognition.  He still drives locally and during daylight hours, without issue.  He has not had further episodes of confusion.  HISTORY: In late 2014, he had an episode of confusion.  He was driving a friend home, when suddenly he stopped off and parked in the [employer] parking lot.  His goddaughter happened to be walking out and stopped by.  He does not remember this.  He then drove home, instead of dropping off his friend, and walked inside the house, leaving his friend in the car.  A few minutes later, his friend came to the door.  Again, he had no recollection of this.  Later that night, he complained of right shoulder pain and he went to the ED.  They waited for over 2 hours and he was back to normal, so they decided to leave.  They saw their PCP the next day.  He was having upper respiratory symptoms.  His WBC was 13.4.  CT of the chest revealed pulmonary fibrosis and pneumonia.  He was started on Levoquin.  His blood pressure was 90/60.  Amlodipine was discontinued.  An MRI of the brain was ordered in January 2015 and revealed significant chronic small vessel disease and lacunar infarcts.  He underwent a stroke workup in February 2015.  2D echo showed EF 55-60%.  Carotid doppler showed 1-39% bilateral ICA stenosis.  At the time, he had a B12 of 306, so a methylmalonic acid level was checked, which was normal.  He was advised to take ASA 81mg  daily.  In April 2016, he took a nap prior to the [place] game.  When he woke up, he seemed a little confused and wasn't acting right.  During the game,  friends came over.  He sat there without saying a word.  He was lethargic and withdrawn.  There was no slurred speech or facial droop.  He was a little better the following morning but didn't really improve until later in the day.  His gait seemed a little slow too.  In May 2016, he had another episode of "confusion".  Again, it occurred after he awoke from a nap.  He was oriented but he just seemed off.  His wife cannot really elaborate.  He had a glazed look in his eyes and was not really carrying on a conversation easily.  It improved by the evening.    He had a left knee replacement 6 years ago and had some trouble with memory for the next couple of years.  It was attributed to effect to anesthesia.  He files his own taxes without problems, except last year he did forget a couple of things which they took care of this year.  He drives without difficulty.  He has forgotten how to start the car.  One time, he went into the pharmacy.  When he got out, he had realized that he left the key in the ignition and the battery was dead.  He used to handle all the finances.  He will still write the checks but he no longer can keep up with it and forgets to  pay some bills.  He does well otherwise, such as going to a CE course for pharmacists.  His wife thinks this has been a gradual decline.  His father had dementia.  He is not on blood thinners.  He said he took an aspirin once several years ago at the beach and got a rash.  However, it could have been a reaction to sunscreen.  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  EEG performed on 01/23/15 was normal.  Images of MRI of brain without contrast performed on 02/06/15 were reviewed and showed stable chronic small vessel disease but nothing acute.   PAST MEDICAL HISTORY: Past Medical History  Diagnosis Date  . Inguinal hernia   . Hemorrhoids   . Colon polyps   . Allergic rhinitis   . Hypertension   . Arthritis     hands  . H. pylori infection  2001  . Skin cancer     squamous cell  . Hypercholesterolemia   . GERD (gastroesophageal reflux disease)   . Ischemia     small vessel/lacunar infarction  . Pneumonia     MEDICATIONS: Current Outpatient Prescriptions on File Prior to Visit  Medication Sig Dispense Refill  . aspirin EC 81 MG tablet Take 81 mg by mouth daily.    Marland Kitchen omeprazole (PRILOSEC) 20 MG capsule Take 20 mg by mouth daily.    . SYMBICORT 160-4.5 MCG/ACT inhaler      No current facility-administered medications on file prior to visit.    ALLERGIES: Allergies  Allergen Reactions  . Bactrim Ds [Sulfamethoxazole-Trimethoprim] Rash    FAMILY HISTORY: Family History  Problem Relation Age of Onset  . Colon cancer Brother   . Prostate cancer Brother   . Melanoma Mother   . Breast cancer Mother   . Heart failure Mother   . Heart failure Brother     SOCIAL HISTORY: Social History   Social History  . Marital Status: Married    Spouse Name: N/A  . Number of Children: N/A  . Years of Education: N/A   Occupational History  . Not on file.   Social History Main Topics  . Smoking status: Former Research scientist (life sciences)  . Smokeless tobacco: Former Systems developer    Types: Chew     Comment: quit 1985  . Alcohol Use: 0.6 oz/week    1 Standard drinks or equivalent per week  . Drug Use: No  . Sexual Activity: No   Other Topics Concern  . Not on file   Social History Narrative    REVIEW OF SYSTEMS: Constitutional: No fevers, chills, or sweats, no generalized fatigue, change in appetite Eyes: No visual changes, double vision, eye pain Ear, nose and throat: No hearing loss, ear pain, nasal congestion, sore throat Cardiovascular: No chest pain, palpitations Respiratory:  No shortness of breath at rest or with exertion, wheezes GastrointestinaI: No nausea, vomiting, diarrhea, abdominal pain, fecal incontinence Genitourinary:  No dysuria, urinary retention or frequency Musculoskeletal:  No neck pain, back pain Integumentary: No  rash, pruritus, skin lesions Neurological: as above Psychiatric: No depression, insomnia, anxiety Endocrine: No palpitations, fatigue, diaphoresis, mood swings, change in appetite, change in weight, increased thirst Hematologic/Lymphatic:  No anemia, purpura, petechiae. Allergic/Immunologic: no itchy/runny eyes, nasal congestion, recent allergic reactions, rashes  PHYSICAL EXAM: Filed Vitals:   01/13/16 0913  BP: 118/80  Pulse: 76   General: No acute distress.  Patient appears well-groomed.  normal body habitus. Head:  Normocephalic/atraumatic Eyes:  Fundoscopic exam unremarkable without  vessel changes, exudates, hemorrhages or papilledema. Neck: supple, no paraspinal tenderness, full range of motion Heart:  Regular rate and rhythm Lungs:  Clear to auscultation bilaterally Back: No paraspinal tenderness Neurological Exam: alert and oriented to person, place, and time. Attention span and concentration intact, delayed recall 2 or 3 words, remote memory intact, fund of knowledge intact.  Speech fluent and not dysarthric, language intact. CN II-XII intact. Bulk and tone normal, muscle strength 5/5 throughout.  Sensation to light touch, temperature and vibration intact.  Deep tendon reflexes 2+ throughout.  Finger to nose and heel to shin testing intact.  Gait normal.  Romberg negative.  IMPRESSION: 1.  Vascular dementia.  Cannot rule out underlying neurodegenerative disease such as Alzheimer's, but he appears stable at this time. 2.  HTN  PLAN: Continue aspirin daily Continue Aricept 10mg  daily Recommend Dr. Woody Seller start him on statin therapy with LDL goal of less than 70 Recheck carotid doppler Mediterranean diet Limit driving to daylight hours and around town. Follow up in 6 months and will recheck MoCA.  22 minutes spent face to face with patient, over 50% spent discussing management.  Metta Clines, DO  CC:  Jerene Bears, MD

## 2016-01-14 ENCOUNTER — Other Ambulatory Visit: Payer: Medicare Other

## 2016-01-20 ENCOUNTER — Ambulatory Visit: Payer: Medicare Other | Admitting: Neurology

## 2016-01-21 ENCOUNTER — Other Ambulatory Visit: Payer: Medicare Other

## 2016-01-28 ENCOUNTER — Telehealth: Payer: Self-pay

## 2016-01-28 ENCOUNTER — Ambulatory Visit
Admission: RE | Admit: 2016-01-28 | Discharge: 2016-01-28 | Disposition: A | Discharge status: Home / Self Care | Payer: Medicare Other | Source: Ambulatory Visit | Attending: Neurology | Admitting: Neurology

## 2016-01-28 DIAGNOSIS — F0150 Vascular dementia without behavioral disturbance: Secondary | ICD-10-CM

## 2016-01-28 DIAGNOSIS — I10 Essential (primary) hypertension: Secondary | ICD-10-CM

## 2016-01-28 NOTE — Telephone Encounter (Signed)
-----   Message from Pieter Partridge, DO sent at 01/28/2016 12:09 PM EDT ----- Carotid doppler looks okay.  No significant blockage in the carotid arteries.

## 2016-01-28 NOTE — Telephone Encounter (Signed)
Message relayed to wife. Verbalized understanding and denied questions.

## 2016-03-29 IMAGING — MR MR HEAD W/O CM
9 of 10 series · 35 of 48 positions shown · non-contrast
Comparison: [REDACTED] brain MRI 10/12/2013.

CLINICAL DATA: 79-year-old male with multiple TIAs. Subsequent
encounter.

EXAM:
MRI HEAD WITHOUT CONTRAST
TECHNIQUE: Multiplanar, multiecho pulse sequences of the brain and surrounding
structures were obtained without intravenous contrast.

[Series 3: T1 · sagittal · 5.0mm · 0.47mm/px · 1 of 23 slices shown]
[im 1/23]
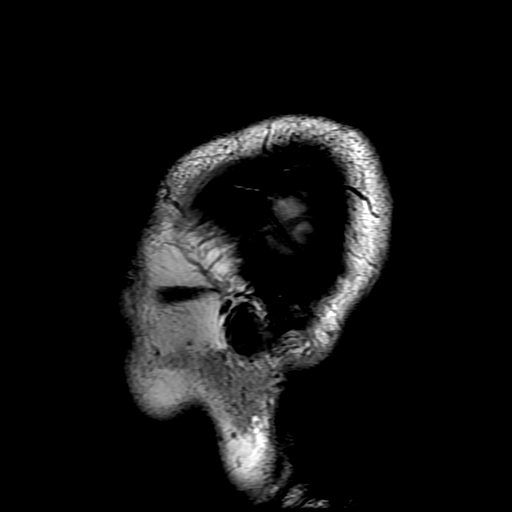

[Series 4: DWI · axial · 3.0mm · 1.09mm/px · z∈[-37,+97]mm · 8 of 92 slices shown (1 of 4)]
[im 1/92]
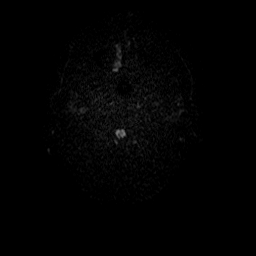
[im 11/92]
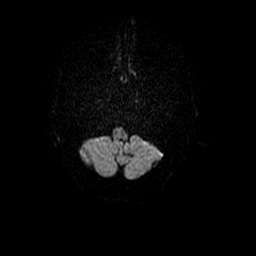
[im 31/92]
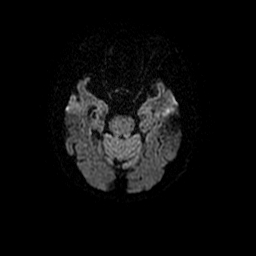
[im 41/92]
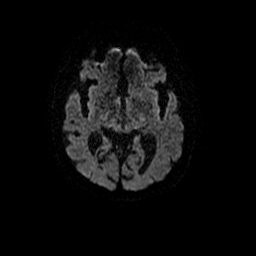
[im 51/92]
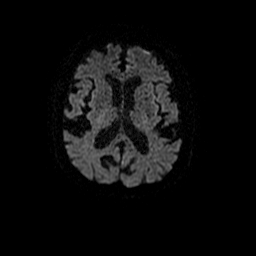
[im 61/92]
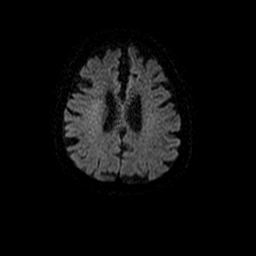
[im 81/92]
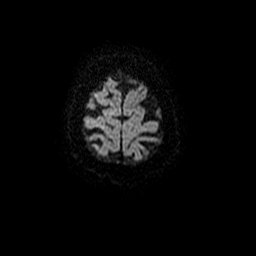
[im 92/92]
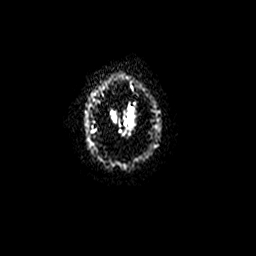

[Series 5: T2 · axial · 5.0mm · 0.43mm/px · z∈[-40,+97]mm · 3 of 24 slices shown (1 of 2)]
[im 1/24]
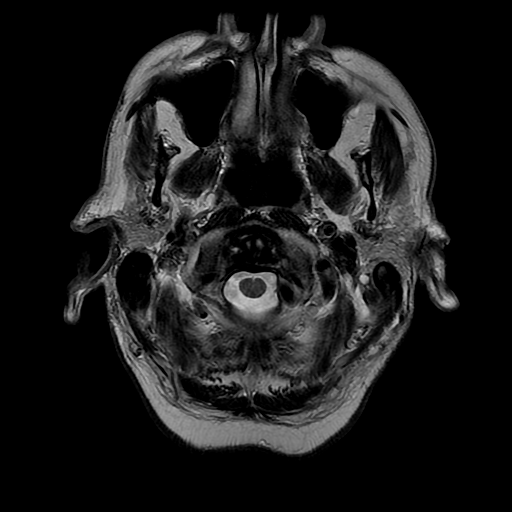
[im 12/24]
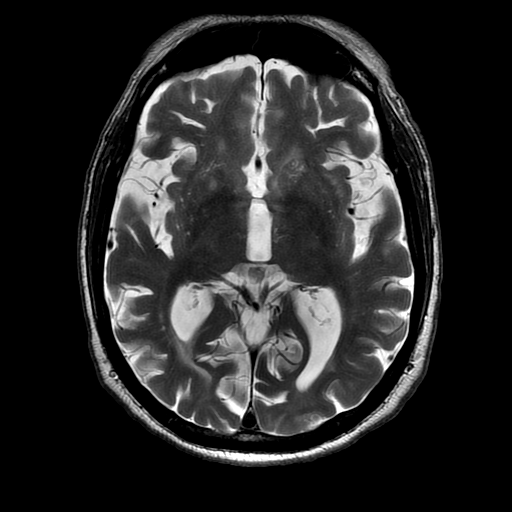
[im 24/24]
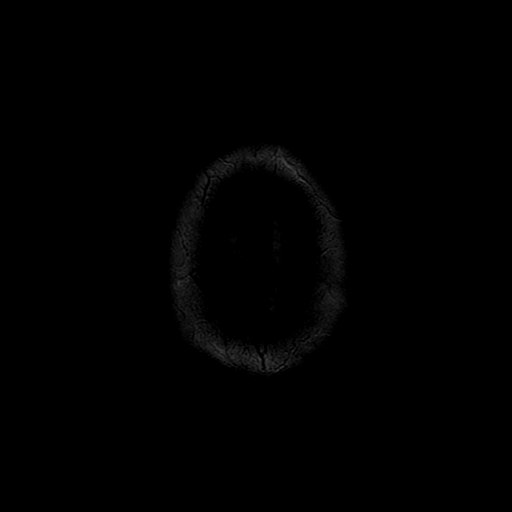

[Series 6: DWI · coronal · 5.0mm · 1.09mm/px · 7 of 66 slices shown (2 of 4)]
[im 1/66]
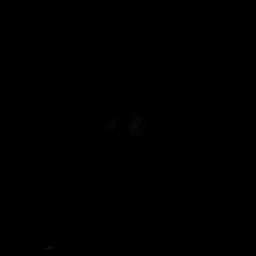
[im 11/66]
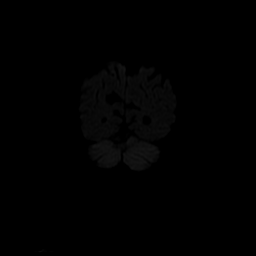
[im 22/66]
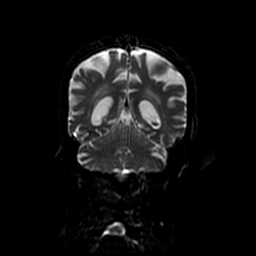
[im 33/66]
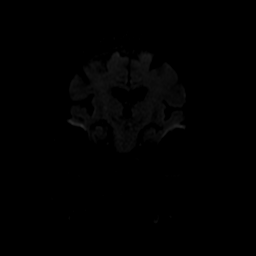
[im 44/66]
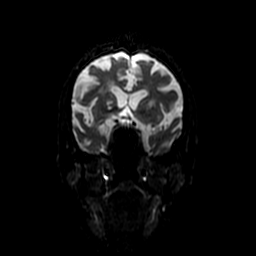
[im 55/66]
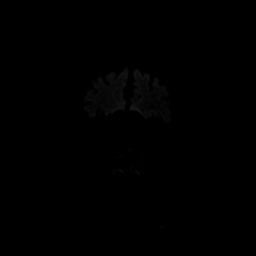
[im 66/66]
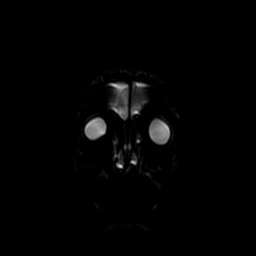

[Series 7: FLAIR · axial · 5.0mm · 0.43mm/px · z∈[-40,+97]mm · 3 of 24 slices shown]
[im 1/24]
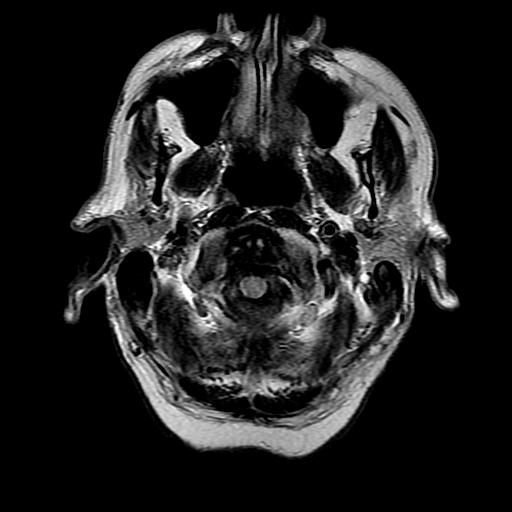
[im 12/24]
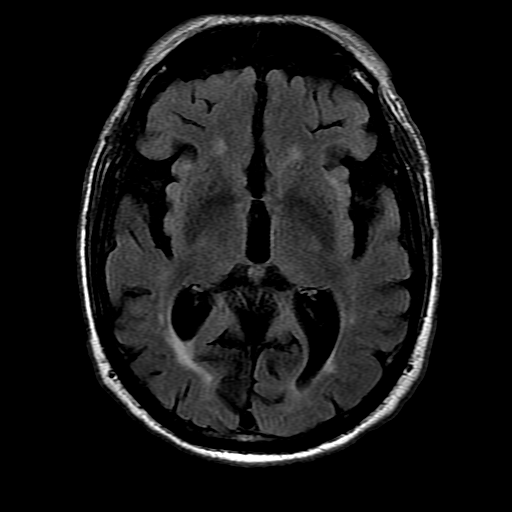
[im 24/24]
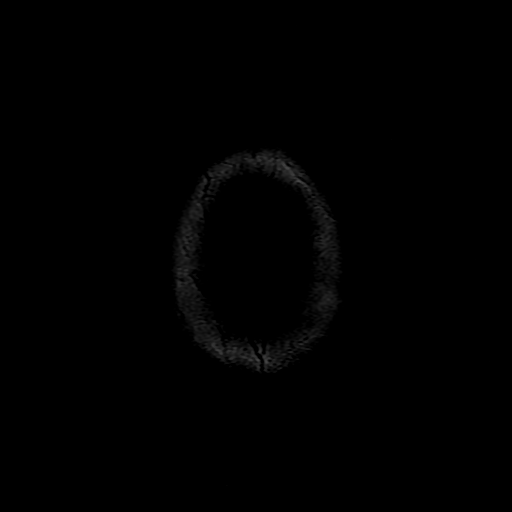

[Series 8: ax mpgr · axial · 5.0mm · 0.43mm/px · z∈[-40,+25]mm · 2 of 24 slices shown]
[im 1/24]
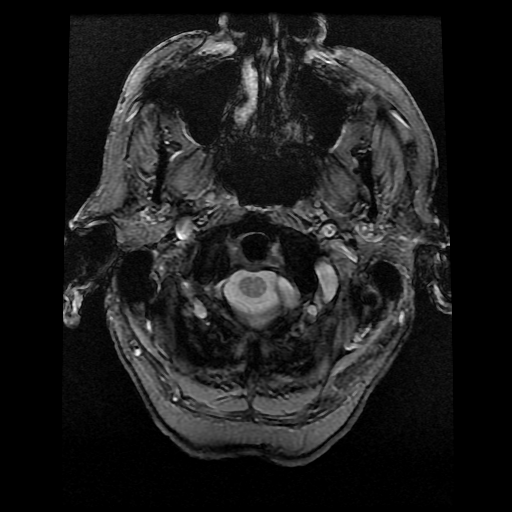
[im 12/24]
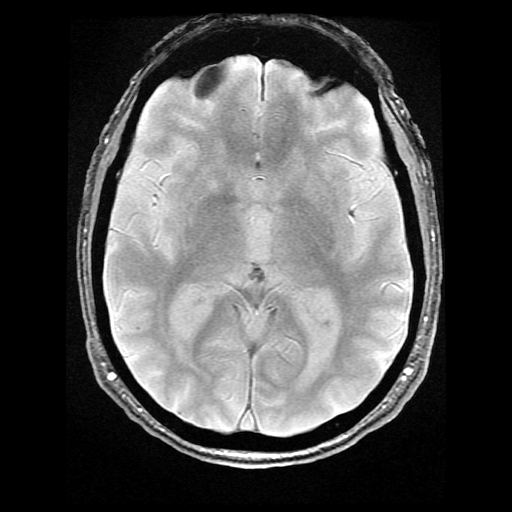

[Series 10: T2 · coronal · 5.0mm · 0.39mm/px · 3 of 26 slices shown (2 of 2)]
[im 1/26]
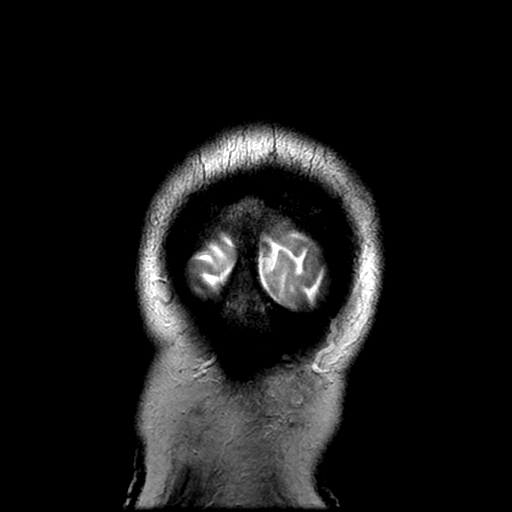
[im 13/26]
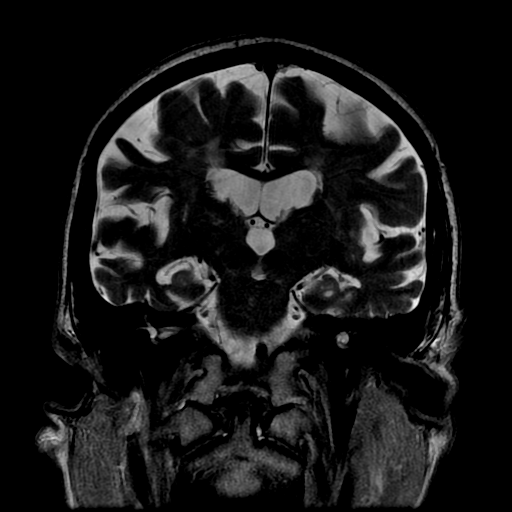
[im 26/26]
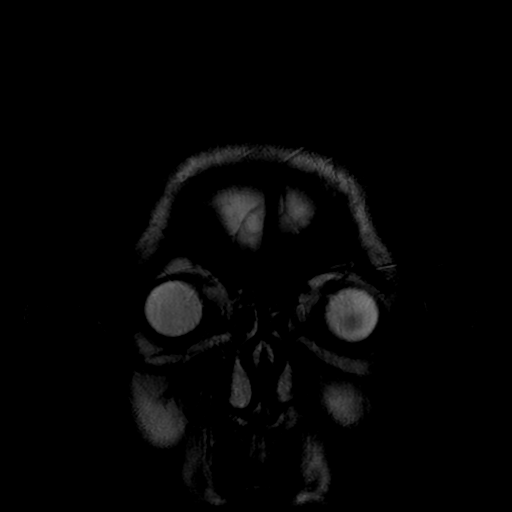

[Series 400: DWI · axial · 3.0mm · 1.09mm/px · z∈[-37,+97]mm · 5 of 46 slices shown (3 of 4)]
[im 1/46]
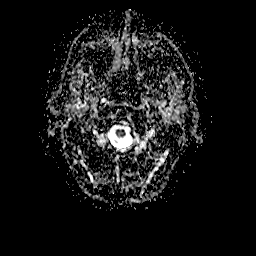
[im 12/46]
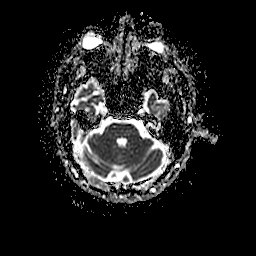
[im 23/46]
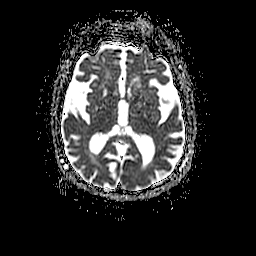
[im 34/46]
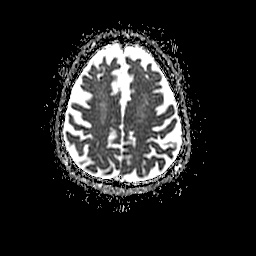
[im 46/46]
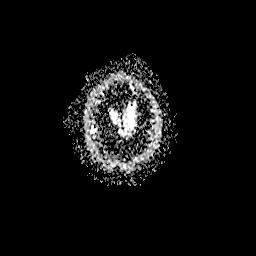

[Series 600: DWI · coronal · 5.0mm · 1.09mm/px · 3 of 33 slices shown (4 of 4)]
[im 1/33]
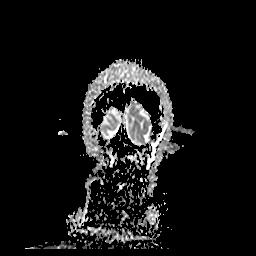
[im 17/33]
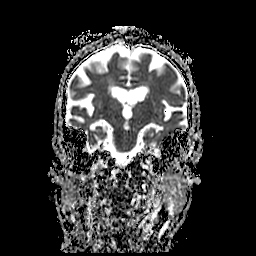
[im 33/33]
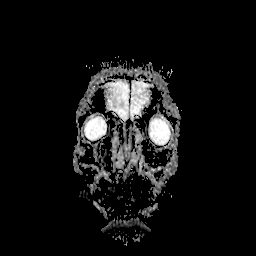

[35 of 48 positions shown; findings below may reference images not displayed]

FINDINGS: Major intracranial vascular flow voids are stable, with dominant
dolichoectasia attic distal left vertebral artery. Basilar
dolichoectasia again noted.

Cerebral volume has not significantly changed since 7265. Confluent
cerebral white matter T2 and FLAIR hyperintensity. Chronic bilateral
caudate lacunar infarcts. No cortically based encephalomalacia. The
thalami and brainstem are within normal limits for age. Stable small
chronic lacunar infarcts in the midline superior cerebellum (series
5, image 8 today). Occasional chronic micro hemorrhages in the brain
are stable.

No restricted diffusion to suggest acute infarction. No midline
shift, mass effect, evidence of mass lesion, ventriculomegaly,
extra-axial collection or acute intracranial hemorrhage.
Cervicomedullary junction and pituitary are within normal limits.
Negative visualized cervical spine.

Visible internal auditory structures appear normal. Mastoids are
clear. Stable mild paranasal sinus mucosal thickening. Visualized
orbit soft tissues are within normal limits. Visualized scalp soft
tissues are within normal limits.
IMPRESSION: 1.  No acute intracranial abnormality.
2. Stable chronic small vessel disease since 7265. Stable
noncontrast MRI appearance of the brain.

## 2016-07-14 ENCOUNTER — Encounter: Payer: Self-pay | Admitting: Neurology

## 2016-07-14 ENCOUNTER — Ambulatory Visit (INDEPENDENT_AMBULATORY_CARE_PROVIDER_SITE_OTHER): Payer: Medicare Other | Admitting: Neurology

## 2016-07-14 VITALS — BP 126/74 | HR 60 | Ht 70.0 in | Wt 188.0 lb

## 2016-07-14 DIAGNOSIS — F0150 Vascular dementia without behavioral disturbance: Secondary | ICD-10-CM

## 2016-07-14 MED ORDER — MEMANTINE HCL-DONEPEZIL HCL ER 28-10 MG PO CP24: 28.0000 mg | ORAL_CAPSULE | Freq: Every day | ORAL | 3 refills | Status: DC

## 2016-07-14 MED ORDER — MEMANTINE HCL-DONEPEZIL HCL 7 & 14 & 21 &28 -10 MG PO C4PK: 28.0000 mg | EXTENDED_RELEASE_CAPSULE | Freq: Every day | ORAL | 0 refills | Status: DC

## 2016-07-14 NOTE — Progress Notes (Signed)
Chart forwarded.  

## 2016-07-14 NOTE — Patient Instructions (Signed)
1.  Stop the Aricept.  Instead, start the Namzaric starter pack (a combination pill of both Aricept and Namenda).  Take as directed and contact us for refills. 2.  I must advise that you should not drive until you undergo a formal driving evaluation with an occupational therapist to assess driving safety. 3.  Continue aspirin 81mg  daily

## 2016-07-14 NOTE — Progress Notes (Signed)
NEUROLOGY FOLLOW UP OFFICE NOTE  FRANKO ATHAS [id number]  HISTORY OF PRESENT ILLNESS: Eric Pratt is a 81 year old right-handed man with hypertension, hypercholesterolemia, GERD, and former smoker who follows up for vascular dementia.  He is accompanied by his wife, who provides some history.   UPDATE: His wife says his memory has gotten a little worse.  Sometimes he wakes up in the middle of the night confused or hallucinating but easily re-orients himself.  He still drives on a limited basis, such as to the pharmacy or grocery store.  Carotid doppler from 01/28/16 showed no significant carotid plaque or stenosis.   HISTORY: In late 2014, he had an episode of confusion.  He was driving a friend home, when suddenly he stopped off and parked in the [employer] parking lot.  His goddaughter happened to be walking out and stopped by.  He does not remember this.  He then drove home, instead of dropping off his friend, and walked inside the house, leaving his friend in the car.  A few minutes later, his friend came to the door.  Again, he had no recollection of this.  Later that night, he complained of right shoulder pain and he went to the ED.  They waited for over 2 hours and he was back to normal, so they decided to leave.  They saw their PCP the next day.  He was having upper respiratory symptoms.  His WBC was 13.4.  CT of the chest revealed pulmonary fibrosis and pneumonia.  He was started on Levoquin.  His blood pressure was 90/60.  Amlodipine was discontinued.  An MRI of the brain was ordered in January 2015 and revealed significant chronic small vessel disease and lacunar infarcts.  He underwent a stroke workup in February 2015.  2D echo showed EF 55-60%.  Carotid doppler showed 1-39% bilateral ICA stenosis.  At the time, he had a B12 of 306, so a methylmalonic acid level was checked, which was normal.  He was advised to take ASA 81mg  daily.   In April 2016, he took a nap prior to the  [place] game.  When he woke up, he seemed a little confused and wasn't acting right.  During the game, friends came over.  He sat there without saying a word.  He was lethargic and withdrawn.  There was no slurred speech or facial droop.  He was a little better the following morning but didn't really improve until later in the day.  His gait seemed a little slow too.   In May 2016, he had another episode of "confusion".  Again, it occurred after he awoke from a nap.  He was oriented but he just seemed off.  His wife cannot really elaborate.  He had a glazed look in his eyes and was not really carrying on a conversation easily.  It improved by the evening.     He had a left knee replacement 6 years ago and had some trouble with memory for the next couple of years.  It was attributed to effect to anesthesia.  He files his own taxes without problems, except last year he did forget a couple of things which they took care of this year.  He drives without difficulty.  He has forgotten how to start the car.  One time, he went into the pharmacy.  When he got out, he had realized that he left the key in the ignition and the battery was dead.  He used  to handle all the finances.  He will still write the checks but he no longer can keep up with it and forgets to pay some bills.  He does well otherwise, such as going to a CE course for pharmacists.  His wife thinks this has been a gradual decline.  His father had dementia.  He is not on blood thinners.  He said he took an aspirin once several years ago at the beach and got a rash.  However, it could have been a reaction to sunscreen.  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   EEG performed on 01/23/15 was normal.  Images of MRI of brain without contrast performed on 02/06/15 were reviewed and showed stable chronic small vessel disease but nothing acute.   PAST MEDICAL HISTORY: Past Medical History:  Diagnosis Date  . Allergic rhinitis   . Arthritis     hands  . Colon polyps   . GERD (gastroesophageal reflux disease)   . H. pylori infection 2001  . Hemorrhoids   . Hypercholesterolemia   . Hypertension   . Inguinal hernia   . Ischemia    small vessel/lacunar infarction  . Pneumonia   . Skin cancer    squamous cell    MEDICATIONS: Current Outpatient Prescriptions on File Prior to Visit  Medication Sig Dispense Refill  . aspirin EC 81 MG tablet Take 81 mg by mouth daily.    Marland Kitchen donepezil (ARICEPT) 10 MG tablet Take 1 tablet (10 mg total) by mouth at bedtime. 90 tablet 5  . omeprazole (PRILOSEC) 20 MG capsule Take 20 mg by mouth daily.    . SYMBICORT 160-4.5 MCG/ACT inhaler      No current facility-administered medications on file prior to visit.     ALLERGIES: Allergies  Allergen Reactions  . Bactrim Ds [Sulfamethoxazole-Trimethoprim] Rash    FAMILY HISTORY: Family History  Problem Relation Age of Onset  . Colon cancer Brother   . Prostate cancer Brother   . Melanoma Mother   . Breast cancer Mother   . Heart failure Mother   . Heart failure Brother    SOCIAL HISTORY: Social History   Social History  . Marital status: Married    Spouse name: N/A  . Number of children: N/A  . Years of education: N/A   Occupational History  . Not on file.   Social History Main Topics  . Smoking status: Former Research scientist (life sciences)  . Smokeless tobacco: Former Systems developer    Types: Chew     Comment: quit 1985  . Alcohol use 0.6 oz/week    1 Standard drinks or equivalent per week  . Drug use: No  . Sexual activity: No   Other Topics Concern  . Not on file   Social History Narrative  . No narrative on file    REVIEW OF SYSTEMS: Constitutional: No fevers, chills, or sweats, no generalized fatigue, change in appetite Eyes: No visual changes, double vision, eye pain Ear, nose and throat: No hearing loss, ear pain, nasal congestion, sore throat Cardiovascular: No chest pain, palpitations Respiratory:  No shortness of breath at rest or with  exertion, wheezes GastrointestinaI: No nausea, vomiting, diarrhea, abdominal pain, fecal incontinence Genitourinary:  No dysuria, urinary retention or frequency Musculoskeletal:  No neck pain, back pain Integumentary: No rash, pruritus, skin lesions Neurological: as above Psychiatric: No depression, insomnia, anxiety Endocrine: No palpitations, fatigue, diaphoresis, mood swings, change in appetite, change in weight, increased thirst Hematologic/Lymphatic:  No purpura, petechiae. Allergic/Immunologic:  no itchy/runny eyes, nasal congestion, recent allergic reactions, rashes  PHYSICAL EXAM: Vitals:   07/14/16 1056  BP: 126/74  Pulse: 60   General: No acute distress.  Patient appears well-groomed.  normal body habitus. Head:  Normocephalic/atraumatic Eyes:  Fundi examined but not visualized Neck: supple, no paraspinal tenderness, full range of motion Heart:  Regular rate and rhythm Lungs:  Clear to auscultation bilaterally Back: No paraspinal tenderness Neurological Exam: alert and oriented to person, place, and time. Attention span and concentration intact, recent and remote memory intact, fund of knowledge intact.  Speech fluent and not dysarthric, language intact.   Montreal Cognitive Assessment  07/14/2016 07/23/2015 01/17/2015  Visuospatial/ Executive (0/5) 2 4 4   Naming (0/3) 3 3 3   Attention: Read list of digits (0/2) 2 2 1   Attention: Read list of letters (0/1) 1 1 1   Attention: Serial 7 subtraction starting at 100 (0/3) 3 3 3   Language: Repeat phrase (0/2) 1 2 1   Language : Fluency (0/1) 1 0 0  Abstraction (0/2) 0 2 2  Delayed Recall (0/5) 1 0 1  Orientation (0/6) 6 5 5   Total 20 22 21   Adjusted Score (based on education) - 22 21   CN II-XII intact. Bulk and tone normal, muscle strength 5/5 throughout.  Sensation to light touch  intact.  Deep tendon reflexes 2+ throughout.  Finger to nose testing intact.  Gait normal, Romberg negative.  IMPRESSION: Vascular dementia.   Cannot rule out underlying neurodegenerative disease such as Alzheimer's.  PLAN: Will switch from Aricept to Namzaric ASA 81mg  daily Advised that he should not drive unless he gets a formal driving evaluation with OT Follow up in 9 months.  27 minutes spent face to face with patient, over 50% spent counseling.  Metta Clines, DO  CC:  Jerene Bears, MD

## 2016-07-15 ENCOUNTER — Telehealth: Payer: Self-pay | Admitting: Neurology

## 2016-07-15 NOTE — Telephone Encounter (Signed)
Eric Pratt 18-Sep-2035. His wife called regarding his last appointment. Dr. Tomi Likens had recommended a less expensive OT driving school. She would like you to please call her at [phone] she said you could lmom if she does not answer. Thank you

## 2016-07-15 NOTE — Telephone Encounter (Signed)
Spoke with wife. Would like driving referral placed. Form filled out for [facility]. Placed in provider's box for signature. Will fax in a.m. After provider has signed.

## 2016-07-27 ENCOUNTER — Telehealth: Payer: Self-pay | Admitting: Neurology

## 2016-07-27 DIAGNOSIS — F0150 Vascular dementia without behavioral disturbance: Secondary | ICD-10-CM

## 2016-07-27 NOTE — Telephone Encounter (Signed)
Patient wife Jana Half called and would like to talk to someone about the driving test please call [phone]

## 2016-07-27 NOTE — Telephone Encounter (Signed)
Spoke with wife. [facility] cannot do driving eval until December. Referral faxed to [facility] in [place].

## 2017-01-14 ENCOUNTER — Other Ambulatory Visit: Payer: Self-pay | Admitting: Neurology

## 2017-04-15 ENCOUNTER — Ambulatory Visit (INDEPENDENT_AMBULATORY_CARE_PROVIDER_SITE_OTHER): Payer: Medicare Other | Admitting: Neurology

## 2017-04-15 ENCOUNTER — Encounter: Payer: Self-pay | Admitting: Neurology

## 2017-04-15 VITALS — BP 104/60 | HR 51 | Ht 70.0 in | Wt 184.0 lb

## 2017-04-15 DIAGNOSIS — F0150 Vascular dementia without behavioral disturbance: Secondary | ICD-10-CM | POA: Diagnosis not present

## 2017-04-15 NOTE — Progress Notes (Addendum)
NEUROLOGY FOLLOW UP OFFICE NOTE  Eric Pratt [id number]  HISTORY OF PRESENT ILLNESS: Eric Pratt is a 82 year old right-handed man with hypertension, hypercholesterolemia, GERD, and former smoker who follows up for vascular dementia.  He is accompanied by his wife, who supplements history.   UPDATE: He is taking Namzaric.  He is doing well.  No changes.  He never had the formal driving evaluation but he has agreed to stop driving.  He sleeps well.  Appetite is good.  He was started on citalopram last month by Dr. Woody Seller for mildly depressed mood.  He is feeling better.     HISTORY: In late 2014, he had an episode of confusion.  He was driving a friend home, when suddenly he stopped off and parked in the [employer] parking lot.  His goddaughter happened to be walking out and stopped by.  He does not remember this.  He then drove home, instead of dropping off his friend, and walked inside the house, leaving his friend in the car.  A few minutes later, his friend came to the door.  Again, he had no recollection of this.  Later that night, he complained of right shoulder pain and he went to the ED.  They waited for over 2 hours and he was back to normal, so they decided to leave.  They saw their PCP the next day.  He was having upper respiratory symptoms.  His WBC was 13.4.  CT of the chest revealed pulmonary fibrosis and pneumonia.  He was started on Levoquin.  His blood pressure was 90/60.  Amlodipine was discontinued.  An MRI of the brain was ordered in January 2015 and revealed significant chronic small vessel disease and lacunar infarcts.  He underwent a stroke workup in February 2015.  2D echo showed EF 55-60%.  Carotid doppler showed 1-39% bilateral ICA stenosis.  At the time, he had a B12 of 306, so a methylmalonic acid level was checked, which was normal.  He was advised to take ASA 81mg  daily.   In April 2016, he took a nap prior to the [place] game.  When he woke up, he seemed  a little confused and wasn't acting right.  During the game, friends came over.  He sat there without saying a word.  He was lethargic and withdrawn.  There was no slurred speech or facial droop.  He was a little better the following morning but didn't really improve until later in the day.  His gait seemed a little slow too.   In May 2016, he had another episode of "confusion".  Again, it occurred after he awoke from a nap.  He was oriented but he just seemed off.  His wife cannot really elaborate.  He had a glazed look in his eyes and was not really carrying on a conversation easily.  It improved by the evening.     He had a left knee replacement in 2010 and had some trouble with memory for the next couple of years.  It was attributed to effect to anesthesia.  He files his own taxes without problems, except last year he did forget a couple of things which they took care of this year.  He drives without difficulty.  He has forgotten how to start the car.  One time, he went into the pharmacy.  When he got out, he had realized that he left the key in the ignition and the battery was dead.  He used  to handle all the finances.  He will still write the checks but he no longer can keep up with it and forgets to pay some bills.  He does well otherwise, such as going to a CE course for pharmacists.  His wife thinks this has been a gradual decline.  His father had dementia.  He is not on blood thinners.  He said he took an aspirin once [place] ago at the beach and got a rash.  However, it could have been a reaction to sunscreen.  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   EEG performed on 01/23/15 was normal.  Images of MRI of brain without contrast performed on 02/06/15 were reviewed and showed stable chronic small vessel disease but nothing acute.   Carotid doppler from 01/28/16 showed no significant carotid plaque or stenosis.  PAST MEDICAL HISTORY: Past Medical History:  Diagnosis Date    . Allergic rhinitis   . Arthritis    hands  . Colon polyps   . GERD (gastroesophageal reflux disease)   . H. pylori infection 2001  . Hemorrhoids   . Hypercholesterolemia   . Hypertension   . Inguinal hernia   . Ischemia    small vessel/lacunar infarction  . Pneumonia   . Skin cancer    squamous cell    MEDICATIONS: Current Outpatient Prescriptions on File Prior to Visit  Medication Sig Dispense Refill  . aspirin EC 81 MG tablet Take 81 mg by mouth daily.    Marland Kitchen NAMZARIC 28-10 MG CP24 TAKE 1 CAPSULE BY MOUTH DAILY 30 capsule 3  . omeprazole (PRILOSEC) 20 MG capsule Take 20 mg by mouth daily.    . SYMBICORT 160-4.5 MCG/ACT inhaler     . donepezil (ARICEPT) 10 MG tablet Take 1 tablet (10 mg total) by mouth at bedtime. (Patient not taking: Reported on 04/15/2017) 90 tablet 5   No current facility-administered medications on file prior to visit.     ALLERGIES: Allergies  Allergen Reactions  . Bactrim Ds [Sulfamethoxazole-Trimethoprim] Rash    FAMILY HISTORY: Family History  Problem Relation Age of Onset  . Colon cancer Brother   . Prostate cancer Brother   . Melanoma Mother   . Breast cancer Mother   . Heart failure Mother   . Heart failure Brother     SOCIAL HISTORY: Social History   Social History  . Marital status: Married    Spouse name: N/A  . Number of children: N/A  . Years of education: N/A   Occupational History  . Not on file.   Social History Main Topics  . Smoking status: Former Research scientist (life sciences)  . Smokeless tobacco: Former Systems developer    Types: Chew     Comment: quit 1985  . Alcohol use 0.6 oz/week    1 Standard drinks or equivalent per week  . Drug use: No  . Sexual activity: No   Other Topics Concern  . Not on file   Social History Narrative  . No narrative on file    REVIEW OF SYSTEMS: Constitutional: No fevers, chills, or sweats, no generalized fatigue, change in appetite Eyes: No visual changes, double vision, eye pain Ear, nose and throat: No  hearing loss, ear pain, nasal congestion, sore throat Cardiovascular: No chest pain, palpitations Respiratory:  No shortness of breath at rest or with exertion, wheezes GastrointestinaI: No nausea, vomiting, diarrhea, abdominal pain, fecal incontinence Genitourinary:  No dysuria, urinary retention or frequency Musculoskeletal:  No neck pain, back pain  Integumentary: No rash, pruritus, skin lesions Neurological: as above Psychiatric: No depression, insomnia, anxiety Endocrine: No palpitations, fatigue, diaphoresis, mood swings, change in appetite, change in weight, increased thirst Hematologic/Lymphatic:  No purpura, petechiae. Allergic/Immunologic: no itchy/runny eyes, nasal congestion, recent allergic reactions, rashes  PHYSICAL EXAM: Vitals:   04/15/17 1118  BP: 104/60  Pulse: (!) 51   General: No acute distress.  Patient appears well-groomed.  normal body habitus. Head:  Normocephalic/atraumatic Eyes:  Fundi examined but not visualized Neck: supple, no paraspinal tenderness, full range of motion Heart:  Regular rate and rhythm Lungs:  Clear to auscultation bilaterally Back: No paraspinal tenderness Neurological Exam: alert and oriented to person, place, and time. Attention span and concentration intact, recent memory poor, remote memory intact, fund of knowledge intact.  Speech fluent and not dysarthric, language intact.  Visuospatial and executive dysfunction noted and naming fluency mildly impaired.  Montreal Cognitive Assessment  04/15/2017 07/14/2016 07/23/2015 01/17/2015  Visuospatial/ Executive (0/5) 1 2 4 4   Naming (0/3) 3 3 3 3   Attention: Read list of digits (0/2) 2 2 2 1   Attention: Read list of letters (0/1) 1 1 1 1   Attention: Serial 7 subtraction starting at 100 (0/3) 3 3 3 3   Language: Repeat phrase (0/2) 0 1 2 1   Language : Fluency (0/1) 0 1 0 0  Abstraction (0/2) 2 0 2 2  Delayed Recall (0/5) 1 1 0 1  Orientation (0/6) 6 6 5 5   Total 19 20 22 21   Adjusted Score  (based on education) 19 - 22 21   CN II-XII intact. Bulk and tone normal, muscle strength 5/5 throughout.  Sensation to light touch, temperature and vibration intact.  Deep tendon reflexes 2+ throughout, toes downgoing.  Finger to nose and heel to shin testing intact.  Gait normal, Romberg negative.  IMPRESSION: Vascular dementia  PLAN: 1.  Namzaric 2.  Continue ASA daily 3.  Should be on a statin.  Will obtain recent lipid panel and start accordingly. 4.  Follow up in 9 months.  27 minutes spent face to face with patient, over 50% spent discussing management.  Metta Clines, DO  CC:  Jerene Bears, MD  ADDENDUM:  Reviewed lipid panel from 05/23/16.  Total cholesterol is 179, TG 229. HDL 40 and LDL 93.  I won't start a statin but I recommended that they discuss treatment for elevated triglycerides with Dr. Corky Sox, DO

## 2017-04-15 NOTE — Patient Instructions (Signed)
1.  Continue the Namzaric 2.  Continue aspirin daily 3.  You should be on a statin for cholesterol.  We will get labs from Dr. Woody Seller to check. 4.  Follow up in 9 months.

## 2017-04-21 ENCOUNTER — Telehealth: Payer: Self-pay | Admitting: *Deleted

## 2017-04-21 NOTE — Telephone Encounter (Signed)
-----   Message from Pieter Partridge, DO sent at 04/21/2017 11:21 AM EDT ----- I reviewed his cholesterol.  It actually looks okay so I wouldn't start a statin.  However, his triglycerides are elevated, so that may need to be discussed with Dr. Woody Seller.

## 2017-04-21 NOTE — Telephone Encounter (Signed)
Left message giving patient results and instructions.   

## 2017-06-21 ENCOUNTER — Encounter: Payer: Self-pay | Admitting: Internal Medicine

## 2017-09-19 ENCOUNTER — Other Ambulatory Visit: Payer: Self-pay | Admitting: Neurology

## 2018-01-17 ENCOUNTER — Encounter: Payer: Self-pay | Admitting: Neurology

## 2018-01-17 ENCOUNTER — Ambulatory Visit: Payer: Medicare Other | Admitting: Neurology

## 2018-01-17 VITALS — BP 100/68 | HR 104 | Ht 70.0 in | Wt 186.0 lb

## 2018-01-17 DIAGNOSIS — F0150 Vascular dementia without behavioral disturbance: Secondary | ICD-10-CM | POA: Diagnosis not present

## 2018-01-17 DIAGNOSIS — F0280 Dementia in other diseases classified elsewhere without behavioral disturbance: Secondary | ICD-10-CM | POA: Diagnosis not present

## 2018-01-17 DIAGNOSIS — G309 Alzheimer's disease, unspecified: Secondary | ICD-10-CM | POA: Diagnosis not present

## 2018-01-17 NOTE — Patient Instructions (Signed)
1.  Continue Namzaric daily 2.  Please review resources 3.  Follow up in 9 months.

## 2018-01-17 NOTE — Progress Notes (Signed)
NEUROLOGY FOLLOW UP OFFICE NOTE  Eric Pratt [id number]  HISTORY OF PRESENT ILLNESS: Eric Pratt is a 83 year old right-handed man with hypertension, hypercholesterolemia, GERD, and former smoker who follows up for vascular dementia.  He is accompanied by his wife, who supplements history.   UPDATE: He is taking Namzaric.  Short term memory is a little worse.  He can't remember why he walked into a room.  He repeats questions.  He performs his ADLs such as bathing, dressing, eating, using toilet.  He does not drive.  His wife manages finances and makes sure he remembers to take his medication.  He is not combative.  He recently recovered from double pneumonia.  Appetite is good but he doesn't eat as much.  He sleeps well.  He is pleasant and not agitated.     HISTORY: In late 2014, he had an episode of confusion.  He was driving a friend home, when suddenly he stopped off and parked in the [employer] parking lot.  His goddaughter happened to be walking out and stopped by.  He does not remember this.  He then drove home, instead of dropping off his friend, and walked inside the house, leaving his friend in the car.  A few minutes later, his friend came to the door.  Again, he had no recollection of this.  Later that night, he complained of right shoulder pain and he went to the ED.  They waited for over 2 hours and he was back to normal, so they decided to leave.  They saw their PCP the next day.  He was having upper respiratory symptoms.  His WBC was 13.4.  CT of the chest revealed pulmonary fibrosis and pneumonia.  He was started on Levoquin.  His blood pressure was 90/60.  Amlodipine was discontinued.  An MRI of the brain was ordered in January 2015 and revealed significant chronic small vessel disease and lacunar infarcts.  He underwent a stroke workup in February 2015.  2D echo showed EF 55-60%.  Carotid doppler showed 1-39% bilateral ICA stenosis.  At the time, he had a B12 of  306, so a methylmalonic acid level was checked, which was normal.  He was advised to take ASA 81mg  daily.   In April 2016, he took a nap prior to the [place] game.  When he woke up, he seemed a little confused and wasn't acting right.  During the game, friends came over.  He sat there without saying a word.  He was lethargic and withdrawn.  There was no slurred speech or facial droop.  He was a little better the following morning but didn't really improve until later in the day.  His gait seemed a little slow too.   In May 2016, he had another episode of "confusion".  Again, it occurred after he awoke from a nap.  He was oriented but he just seemed off.  His wife cannot really elaborate.  He had a glazed look in his eyes and was not really carrying on a conversation easily.  It improved by the evening.     He had a left knee replacement in 2010 and had some trouble with memory for the next couple of years.  It was attributed to effect to anesthesia.  He files his own taxes without problems, except last year he did forget a couple of things which they took care of this year.  He drives without difficulty.  He has forgotten how to  start the car.  One time, he went into the pharmacy.  When he got out, he had realized that he left the key in the ignition and the battery was dead.  He used to handle all the finances.  He will still write the checks but he no longer can keep up with it and forgets to pay some bills.  He does well otherwise, such as going to a CE course for pharmacists.  His wife thinks this has been a gradual decline.  His father had dementia.  He is not on blood thinners.  He said he took an aspirin once several years ago at the beach and got a rash.  However, it could have been a reaction to sunscreen.  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   EEG performed on 01/23/15 was normal.  Images of MRI of brain without contrast performed on 02/06/15 were reviewed and showed stable  chronic small vessel disease but nothing acute.    Carotid doppler from 01/28/16 showed no significant carotid plaque or stenosis.  PAST MEDICAL HISTORY: Past Medical History:  Diagnosis Date  . Allergic rhinitis   . Arthritis    hands  . Colon polyps   . GERD (gastroesophageal reflux disease)   . H. pylori infection 2001  . Hemorrhoids   . Hypercholesterolemia   . Hypertension   . Inguinal hernia   . Ischemia    small vessel/lacunar infarction  . Pneumonia   . Skin cancer    squamous cell    MEDICATIONS: Current Outpatient Medications on File Prior to Visit  Medication Sig Dispense Refill  . aspirin EC 81 MG tablet Take 81 mg by mouth daily.    Ileene Patrick Products (PERIDRIN-C) 200-50-150 MG TABS Take by mouth daily.    . Calcium-Magnesium-Zinc 333-133-5 MG TABS Take by mouth daily.    . Cetirizine HCl 10 MG CAPS Take by mouth daily.    . citalopram (CELEXA) 10 MG tablet Take 10 mg by mouth daily.    Marland Kitchen donepezil (ARICEPT) 10 MG tablet Take 1 tablet (10 mg total) by mouth at bedtime. (Patient not taking: Reported on 04/15/2017) 90 tablet 5  . NAMZARIC 28-10 MG CP24 TAKE 1 CAPSULE BY MOUTH DAILY 30 capsule 3  . omeprazole (PRILOSEC) 20 MG capsule Take 20 mg by mouth daily.    . SYMBICORT 160-4.5 MCG/ACT inhaler      No current facility-administered medications on file prior to visit.     ALLERGIES: Allergies  Allergen Reactions  . Bactrim Ds [Sulfamethoxazole-Trimethoprim] Rash    FAMILY HISTORY: Family History  Problem Relation Age of Onset  . Colon cancer Brother   . Prostate cancer Brother   . Melanoma Mother   . Breast cancer Mother   . Heart failure Mother   . Heart failure Brother     SOCIAL HISTORY: Social History   Socioeconomic History  . Marital status: Married    Spouse name: Not on file  . Number of children: Not on file  . Years of education: Not on file  . Highest education level: Not on file  Occupational History  . Not on file  Social  Needs  . Financial resource strain: Not on file  . Food insecurity:    Worry: Not on file    Inability: Not on file  . Transportation needs:    Medical: Not on file    Non-medical: Not on file  Tobacco Use  . Smoking status: Former  Smoker  . Smokeless tobacco: Former Systems developer    Types: Chew  . Tobacco comment: quit 1985  Substance and Sexual Activity  . Alcohol use: Yes    Alcohol/week: 0.6 oz    Types: 1 Standard drinks or equivalent per week  . Drug use: No  . Sexual activity: Never  Lifestyle  . Physical activity:    Days per week: Not on file    Minutes per session: Not on file  . Stress: Not on file  Relationships  . Social connections:    Talks on phone: Not on file    Gets together: Not on file    Attends religious service: Not on file    Active member of club or organization: Not on file    Attends meetings of clubs or organizations: Not on file    Relationship status: Not on file  . Intimate partner violence:    Fear of current or ex partner: Not on file    Emotionally abused: Not on file    Physically abused: Not on file    Forced sexual activity: Not on file  Other Topics Concern  . Not on file  Social History Narrative  . Not on file    REVIEW OF SYSTEMS: Constitutional: No fevers, chills, or sweats, no generalized fatigue, change in appetite Eyes: No visual changes, double vision, eye pain Ear, nose and throat: No hearing loss, ear pain, nasal congestion, sore throat Cardiovascular: No chest pain, palpitations Respiratory:  No shortness of breath at rest or with exertion, wheezes GastrointestinaI: No nausea, vomiting, diarrhea, abdominal pain, fecal incontinence Genitourinary:  No dysuria, urinary retention or frequency Musculoskeletal:  No neck pain, back pain Integumentary: No rash, pruritus, skin lesions Neurological: as above Psychiatric: No depression, insomnia, anxiety Endocrine: No palpitations, fatigue, diaphoresis, mood swings, change in  appetite, change in weight, increased thirst Hematologic/Lymphatic:  No purpura, petechiae. Allergic/Immunologic: no itchy/runny eyes, nasal congestion, recent allergic reactions, rashes  PHYSICAL EXAM: Vitals:   01/17/18 1146  BP: 100/68  Pulse: (!) 104   General: No acute distress.  Patient appears well-groomed.  normal body habitus. Head:  Normocephalic/atraumatic Eyes:  Fundi examined but not visualized Neck: supple, no paraspinal tenderness, full range of motion Heart:  Regular rate and rhythm Lungs:  Clear to auscultation bilaterally Back: No paraspinal tenderness Neurological Exam: alert and oriented to person, place, but not time. Attention span and concentration intact, recall fair, remote memory intact, fund of knowledge intact.  Speech fluent and not dysarthric, language intact.  Visuospatial and executive functioning impaired. MMSE - Mini Mental State Exam 01/17/2018  Orientation to time 1  Orientation to Place 5  Registration 3  Attention/ Calculation 3  Recall 2  Language- name 2 objects 2  Language- repeat 1  Language- follow 3 step command 3  Language- read & follow direction 1  Write a sentence 1  Copy design 0  Total score 22   CN II-XII intact. Bulk and tone normal, muscle strength 5/5 throughout.  Sensation to light touch  intact.  Deep tendon reflexes 2+ throughout.  Finger to nose testing intact.  Gait normal, Romberg negative.   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  Namzaric 2.  ASA, blood pressure control, statin therapy 3.  Provided information regarding resources to Mrs. Bhullar 4.  Follow up in 9 months.  25 minutes spent face to face with patient, over 50% spent discussing management.  Metta Clines, DO  CC: Dr. Woody Seller

## 2018-02-03 ENCOUNTER — Encounter: Payer: Self-pay | Admitting: Neurology

## 2018-02-06 ENCOUNTER — Other Ambulatory Visit: Payer: Self-pay | Admitting: Neurology

## 2018-10-20 ENCOUNTER — Ambulatory Visit: Payer: Medicare Other | Admitting: Neurology

## 2018-12-06 NOTE — Progress Notes (Addendum)
NEUROLOGY FOLLOW UP OFFICE NOTE  [name] [id number]  HISTORY OF PRESENT ILLNESS: Eric Pratt is an 84 year old right-handed man with hypertension, hypercholesterolemia, GERD, and former smoker who follows up for mixed Alzheimer's and vascular dementia.  He is accompanied by his wife who supplements history.  UPDATE: He is taking Namzaric.  He had sudden decline on 11/24/18.  He had sudden change in which he was more confused, weak/stiff and slower to respond at the dinner table.  He was admitted to [facility] where he was treated for flu-like symptoms.  He was tested negative for flu.  He was treated with antibiotics and IVF.  Ultimately, he was diagnosed with delirium secondary to COPD exacerbation in setting of acute bronchitis.  He has never been the same.  He naps frequently during the day.  Short term memory is worse.  He can't remember why he walked into a room.  He repeats questions.  He requires a little more help in performing his ADLs such as bathing, dressing, eating, using toilet.  He does not drive.  His wife manages finances and makes sure he remembers to take his medication.  He is not combative.   HISTORY: In late 2014, he had an episode of confusion. He was driving a friend home, when suddenly he stopped off and parked in the [employer] parking lot. His goddaughter happened to be walking out and stopped by. He does not remember this. He then drove home, instead of dropping off his friend, and walked inside the house, leaving his friend in the car. A few minutes later, his friend came to the door. Again, he had no recollection of this. Later that night, he complained of right shoulder pain and he went to the ED. They waited for over 2 hours and he was back to normal, so they decided to leave. They saw their PCP the next day. He was having upper respiratory symptoms. His WBC was 13.4. CT of the chest revealed pulmonary fibrosis and pneumonia. He was  started on Levoquin. His blood pressure was 90/60. Amlodipine was discontinued. An MRI of the brain was ordered in January 2015 and revealed significant chronic small vessel disease and lacunar infarcts. He underwent a stroke workup in February 2015. 2D echo showed EF 55-60%. Carotid doppler showed 1-39% bilateral ICA stenosis. At the time, he had a B12 of 306, so a methylmalonic acid level was checked, which was normal. He was advised to take ASA 81mg  daily.  In April 2016, he took a nap prior to the [place] game. When he woke up, he seemed a little confused and wasn't acting right. During the game, friends came over. He sat there without saying a word. He was lethargic and withdrawn. There was no slurred speech or facial droop. He was a little better the following morning but didn't really improve until later in the day. His gait seemed a little slow too.  In May 2016, he had another episode of "confusion". Again, it occurred after he awoke from a nap. He was oriented but he just seemed off. His wife cannot really elaborate. He had a glazed look in his eyes and was not really carrying on a conversation easily. It improved by the evening.   He had a left knee replacement in 2010 and had some trouble with memory for the next couple of years. It was attributed to effect to anesthesia. He files his own taxes without problems, except last year he did forget a couple  of things which they took care of this year. He drives without difficulty. He has forgotten how to start the car. One time, he went into the pharmacy. When he got out, he had realized that he left the key in the ignition and the battery was dead. He used to handle all the finances. He will still write the checks but he no longer can keep up with it and forgets to pay some bills. He does well otherwise, such as going to a CE course for pharmacists. His wife thinks this has been a gradual decline. His father had  dementia. He is not on blood thinners. He said he took an aspirin once several [place] ago at the beach and got a rash. However, it could have been a reaction to sunscreen. 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  EEG performed on 01/23/15 was normal. Images of MRI of brain without contrast performed on 02/06/15 were reviewed and showed stable chronic small vessel disease but nothing acute.  Carotid doppler from 01/28/16 showed no significant carotid plaque or stenosis.  PAST MEDICAL HISTORY: Past Medical History:  Diagnosis Date  . Allergic rhinitis   . Arthritis    hands  . Colon polyps   . GERD (gastroesophageal reflux disease)   . H. pylori infection 2001  . Hemorrhoids   . Hypercholesterolemia   . Hypertension   . Inguinal hernia   . Ischemia    small vessel/lacunar infarction  . Pneumonia   . Skin cancer    squamous cell    MEDICATIONS: Current Outpatient Medications on File Prior to Visit  Medication Sig Dispense Refill  . aspirin EC 81 MG tablet Take 81 mg by mouth daily.    Ileene Patrick Products (PERIDRIN-C) 200-50-150 MG TABS Take by mouth daily.    . Calcium-Magnesium-Zinc 333-133-5 MG TABS Take by mouth daily.    . Cetirizine HCl 10 MG CAPS Take by mouth daily.    . citalopram (CELEXA) 10 MG tablet Take 10 mg by mouth daily.    Marland Kitchen donepezil (ARICEPT) 10 MG tablet Take 1 tablet (10 mg total) by mouth at bedtime. (Patient not taking: Reported on 04/15/2017) 90 tablet 5  . NAMZARIC 28-10 MG CP24 TAKE 1 CAPSULE BY MOUTH DAILY 30 capsule 5  . omeprazole (PRILOSEC) 20 MG capsule Take 20 mg by mouth daily.    . SYMBICORT 160-4.5 MCG/ACT inhaler      No current facility-administered medications on file prior to visit.     ALLERGIES: Allergies  Allergen Reactions  . Bactrim Ds [Sulfamethoxazole-Trimethoprim] Rash    FAMILY HISTORY: Family History  Problem Relation Age of Onset  . Colon cancer Brother   . Prostate cancer Brother   . Melanoma  Mother   . Breast cancer Mother   . Heart failure Mother   . Heart failure Brother    SOCIAL HISTORY: Social History   Socioeconomic History  . Marital status: Married    Spouse name: Not on file  . Number of children: Not on file  . Years of education: Not on file  . Highest education level: Not on file  Occupational History  . Not on file  Social Needs  . Financial resource strain: Not on file  . Food insecurity:    Worry: Not on file    Inability: Not on file  . Transportation needs:    Medical: Not on file    Non-medical: Not on file  Tobacco Use  . Smoking status:  Former Smoker  . Smokeless tobacco: Former Systems developer    Types: Chew  . Tobacco comment: quit 1985  Substance and Sexual Activity  . Alcohol use: Yes    Alcohol/week: 1.0 standard drinks    Types: 1 Standard drinks or equivalent per week  . Drug use: No  . Sexual activity: Never  Lifestyle  . Physical activity:    Days per week: Not on file    Minutes per session: Not on file  . Stress: Not on file  Relationships  . Social connections:    Talks on phone: Not on file    Gets together: Not on file    Attends religious service: Not on file    Active member of club or organization: Not on file    Attends meetings of clubs or organizations: Not on file    Relationship status: Not on file  . Intimate partner violence:    Fear of current or ex partner: Not on file    Emotionally abused: Not on file    Physically abused: Not on file    Forced sexual activity: Not on file  Other Topics Concern  . Not on file  Social History Narrative  . Not on file    REVIEW OF SYSTEMS: Constitutional: No fevers, chills, or sweats, no generalized fatigue, change in appetite Eyes: No visual changes, double vision, eye pain Ear, nose and throat: No hearing loss, ear pain, nasal congestion, sore throat Cardiovascular: No chest pain, palpitations Respiratory:  No shortness of breath at rest or with exertion,  wheezes GastrointestinaI: No nausea, vomiting, diarrhea, abdominal pain, fecal incontinence Genitourinary:  No dysuria, urinary retention or frequency Musculoskeletal:  No neck pain, back pain Integumentary: No rash, pruritus, skin lesions Neurological: as above Psychiatric: No depression, insomnia, anxiety Endocrine: No palpitations, fatigue, diaphoresis, mood swings, change in appetite, change in weight, increased thirst Hematologic/Lymphatic:  No purpura, petechiae. Allergic/Immunologic: no itchy/runny eyes, nasal congestion, recent allergic reactions, rashes  PHYSICAL EXAM: Blood pressure 98/72, pulse 83, height 5\' 10"  (1.778 m), weight 185 lb (83.9 kg), SpO2 97 %. General: No acute distress.  Patient appears well-groomed.  Head:  Normocephalic/atraumatic Eyes:  Fundi examined but not visualized Neck: supple, no paraspinal tenderness, full range of motion Heart:  Regular rate and rhythm Lungs:  Clear to auscultation bilaterally Back: No paraspinal tenderness Neurological Exam: alert and oriented to person and city only (not state, county, time). Attention span and concentration impaired, recent and remote memory impaired, fund of knowledge reduced.  Speech fluent and not dysarthric, language intact.   MMSE - Mini Mental State Exam 12/08/2018 01/17/2018  Orientation to time 0 1  Orientation to Place 1 5  Registration 3 3  Attention/ Calculation 2 3  Recall 0 2  Language- name 2 objects 0 2  Language- repeat 0 1  Language- follow 3 step command 1 3  Language- read & follow direction 1 1  Write a sentence 0 1  Copy design 0 0  Total score 8 22   Left homonymous hemianopsia.  Otherwise, CN II-XII intact. Bulk and tone normal, muscle strength 5/5 throughout.  Sensation to temperature and vibration intact.  Deep tendon reflexes 2+ throughout.  Finger to nose testing intact.  Gait normal, Romberg negative.  IMPRESSION: 1.  Acute cognitive decline.  Due to acute decline and findings of  left homonymous hemianopsia, concerned about new stroke. 2.  Left homonymous hemianopsia 3.  Mixed Alzheimer's and vascular dementia   PLAN: 1.  MRI of  brain without contrast 2.  Continue Namzaric 3.  Further recommendations pending results 4.  Will obtain hospital records at [facility]. 5.  Otherwise, follow up in 9 months  25 minutes spent face to face with patient, over 50% spent discussing diagnosis and management  Metta Clines, DO  CC: Jerene Bears, MD

## 2018-12-08 ENCOUNTER — Encounter: Payer: Self-pay | Admitting: Neurology

## 2018-12-08 ENCOUNTER — Ambulatory Visit: Payer: Medicare Other | Admitting: Neurology

## 2018-12-08 VITALS — BP 98/72 | HR 83 | Ht 70.0 in | Wt 185.0 lb

## 2018-12-08 DIAGNOSIS — H53462 Homonymous bilateral field defects, left side: Secondary | ICD-10-CM

## 2018-12-08 DIAGNOSIS — G309 Alzheimer's disease, unspecified: Secondary | ICD-10-CM | POA: Diagnosis not present

## 2018-12-08 DIAGNOSIS — R410 Disorientation, unspecified: Secondary | ICD-10-CM | POA: Diagnosis not present

## 2018-12-08 DIAGNOSIS — F0280 Dementia in other diseases classified elsewhere without behavioral disturbance: Secondary | ICD-10-CM

## 2018-12-08 DIAGNOSIS — F0150 Vascular dementia without behavioral disturbance: Secondary | ICD-10-CM | POA: Diagnosis not present

## 2018-12-08 NOTE — Patient Instructions (Addendum)
Due to acute decline in cognition and evidence of vision loss, we will check MRI of brain to evaluate for stroke I will get the records from [place].   Further recommendations after reviewing MRI  We have sent a referral to [facility] for your MRI and they will call you directly to schedule your appt. They are located at [place]. If you need to contact them directly please call [phone].

## 2018-12-14 ENCOUNTER — Other Ambulatory Visit: Payer: Self-pay | Admitting: Neurology

## 2018-12-14 MED ORDER — DIAZEPAM 5 MG PO TABS: ORAL_TABLET | ORAL | 0 refills | Status: AC

## 2018-12-14 NOTE — Telephone Encounter (Signed)
Okay to send valium if needed?

## 2018-12-14 NOTE — Telephone Encounter (Signed)
Yes.  Valium 5mg  taken 30 minutes prior to MRI.

## 2018-12-14 NOTE — Telephone Encounter (Signed)
Left message on machine for patient's wife to call back.   

## 2018-12-14 NOTE — Telephone Encounter (Signed)
Patient's wife called regarding his MRI he has on 12/25/2018. She is wanting to know if Dr. Tomi Likens thinks he should have something called in for him to help him relax? Please Call. Thanks

## 2018-12-14 NOTE — Telephone Encounter (Signed)
Spoke with patient's wife and she is agreeable to give valium if needed.   Dr. Tomi Likens - please sign RX.

## 2018-12-25 ENCOUNTER — Other Ambulatory Visit: Payer: Medicare Other

## 2019-01-08 ENCOUNTER — Other Ambulatory Visit: Payer: Medicare Other

## 2019-01-29 ENCOUNTER — Other Ambulatory Visit: Payer: Self-pay

## 2019-01-29 ENCOUNTER — Ambulatory Visit
Admission: RE | Admit: 2019-01-29 | Discharge: 2019-01-29 | Disposition: A | Discharge status: Home / Self Care | Payer: Medicare Other | Source: Ambulatory Visit | Attending: Neurology | Admitting: Neurology

## 2019-01-29 DIAGNOSIS — H53462 Homonymous bilateral field defects, left side: Secondary | ICD-10-CM

## 2019-01-29 DIAGNOSIS — R410 Disorientation, unspecified: Secondary | ICD-10-CM

## 2019-02-08 ENCOUNTER — Telehealth: Payer: Self-pay | Admitting: Neurology

## 2019-02-08 NOTE — Telephone Encounter (Signed)
Patient's wife called needing to see if his MRI results were in? Please Call. Thanks

## 2019-02-12 NOTE — Telephone Encounter (Signed)
Called and spoke with Jana Half, advised her of MRI results.

## 2019-02-27 ENCOUNTER — Other Ambulatory Visit: Payer: Medicare Other

## 2019-08-15 ENCOUNTER — Telehealth: Payer: Self-pay | Admitting: Neurology

## 2019-08-15 MED ORDER — TRAZODONE HCL 50 MG PO TABS: 25.0000 mg | ORAL_TABLET | Freq: Every day | ORAL | 5 refills | Status: AC

## 2019-08-15 NOTE — Telephone Encounter (Signed)
Pt c/o: worsening dementia/more confused New medications?  No. When did changes start?  Notice about 1 month or so not getting any better Sleeping okay? No. Has patient been checked for infection, including UTI?  No. Danger to themselves or others? No. Current medications prescribed by Dr. Tomi Likens: None  Pt was taken Namzaric he has been off about 6 months or longer.  Pt spouse is requesting something to help with sleep and agitation

## 2019-08-15 NOTE — Telephone Encounter (Signed)
Has he been on Trazodone in the past? If not, can try low dose Trazodone 25mg  qhs, can increase if needed. Main side effect would be drowsiness, so hopefully helps him sleep. Thanks

## 2019-08-15 NOTE — Telephone Encounter (Signed)
Wife called regarding her husband and his Dementia changing quite a bit. She said that he is not sleeping and getting up wondering around. She said he has went outside and she found him in the car. He was taking Namzaric. He seemed to be worse on it "very agitated". She is waning to know if there is a sleeping aid they may help him sleep and his anxiety? He uses [facility]. Please Call [phone]. Thank you

## 2019-08-15 NOTE — Telephone Encounter (Signed)
Called spoke with spouse she states that he has not tired trazodone in the past but will be wiling to try it.  Rx was sent to pharmacy.

## 2019-10-20 DIAGNOSIS — J8410 Pulmonary fibrosis, unspecified: Secondary | ICD-10-CM | POA: Diagnosis not present

## 2019-11-20 DIAGNOSIS — J8410 Pulmonary fibrosis, unspecified: Secondary | ICD-10-CM | POA: Diagnosis not present

## 2019-11-28 ENCOUNTER — Ambulatory Visit: Payer: Medicare Other | Attending: Internal Medicine

## 2019-11-28 DIAGNOSIS — Z23 Encounter for immunization: Secondary | ICD-10-CM | POA: Insufficient documentation

## 2019-11-28 NOTE — Progress Notes (Signed)
   Covid-19 Vaccination Clinic  Name:  Eric Pratt    MRN: [id number] DOB: Nov 01, 1934  11/28/2019  [name] was observed post Covid-19 immunization for 15 minutes without incidence. He was provided with Vaccine Information Sheet and instruction to access the V-Safe system.   [name] was instructed to call 911 with any severe reactions post vaccine: Marland Kitchen Difficulty breathing  . Swelling of your face and throat  . A fast heartbeat  . A bad rash all over your body  . Dizziness and weakness    Immunizations Administered    Name Date Dose VIS Date Route   Moderna COVID-19 Vaccine 11/28/2019 10:48 AM 0.5 mL 09/11/2019 Intramuscular   Manufacturer: Moderna   Lot: [id number]   [id number]

## 2019-12-07 DIAGNOSIS — J8410 Pulmonary fibrosis, unspecified: Secondary | ICD-10-CM | POA: Diagnosis not present

## 2019-12-07 DIAGNOSIS — Z299 Encounter for prophylactic measures, unspecified: Secondary | ICD-10-CM | POA: Diagnosis not present

## 2019-12-07 DIAGNOSIS — K4090 Unilateral inguinal hernia, without obstruction or gangrene, not specified as recurrent: Secondary | ICD-10-CM | POA: Diagnosis not present

## 2019-12-07 DIAGNOSIS — I10 Essential (primary) hypertension: Secondary | ICD-10-CM | POA: Diagnosis not present

## 2019-12-07 DIAGNOSIS — J9611 Chronic respiratory failure with hypoxia: Secondary | ICD-10-CM | POA: Diagnosis not present

## 2019-12-18 DIAGNOSIS — J8410 Pulmonary fibrosis, unspecified: Secondary | ICD-10-CM | POA: Diagnosis not present

## 2019-12-25 DIAGNOSIS — L409 Psoriasis, unspecified: Secondary | ICD-10-CM | POA: Diagnosis not present

## 2019-12-25 DIAGNOSIS — Z0001 Encounter for general adult medical examination with abnormal findings: Secondary | ICD-10-CM | POA: Diagnosis not present

## 2019-12-25 DIAGNOSIS — J449 Chronic obstructive pulmonary disease, unspecified: Secondary | ICD-10-CM | POA: Diagnosis not present

## 2019-12-25 DIAGNOSIS — J9610 Chronic respiratory failure, unspecified whether with hypoxia or hypercapnia: Secondary | ICD-10-CM | POA: Diagnosis not present

## 2019-12-26 ENCOUNTER — Ambulatory Visit: Payer: Medicare Other | Attending: Internal Medicine

## 2019-12-26 DIAGNOSIS — Z23 Encounter for immunization: Secondary | ICD-10-CM

## 2019-12-26 NOTE — Progress Notes (Signed)
   Covid-19 Vaccination Clinic  Name:  Eric Pratt    MRN: [id number] DOB: [birth year]  12/26/2019  Mr. Eric Pratt was observed post Covid-19 immunization for 15 minutes without incident. He was provided with Vaccine Information Sheet and instruction to access the V-Safe system.   Mr. Eric Pratt was instructed to call 911 with any severe reactions post vaccine: Marland Kitchen Difficulty breathing  . Swelling of face and throat  . A fast heartbeat  . A bad rash all over body  . Dizziness and weakness   Immunizations Administered    Name Date Dose VIS Date Route   Moderna COVID-19 Vaccine 12/26/2019 11:04 AM 0.5 mL 09/11/2019 Intramuscular   Manufacturer: Moderna   Lot: [id number]   [id number]

## 2020-01-18 DIAGNOSIS — J8410 Pulmonary fibrosis, unspecified: Secondary | ICD-10-CM | POA: Diagnosis not present

## 2020-02-07 DIAGNOSIS — J9610 Chronic respiratory failure, unspecified whether with hypoxia or hypercapnia: Secondary | ICD-10-CM | POA: Diagnosis not present

## 2020-02-07 DIAGNOSIS — J449 Chronic obstructive pulmonary disease, unspecified: Secondary | ICD-10-CM | POA: Diagnosis not present

## 2020-02-07 DIAGNOSIS — L409 Psoriasis, unspecified: Secondary | ICD-10-CM | POA: Diagnosis not present

## 2020-02-07 DIAGNOSIS — G309 Alzheimer's disease, unspecified: Secondary | ICD-10-CM | POA: Diagnosis not present

## 2020-02-17 DIAGNOSIS — J8410 Pulmonary fibrosis, unspecified: Secondary | ICD-10-CM | POA: Diagnosis not present

## 2020-02-28 DIAGNOSIS — C44529 Squamous cell carcinoma of skin of other part of trunk: Secondary | ICD-10-CM | POA: Diagnosis not present

## 2020-02-28 DIAGNOSIS — L309 Dermatitis, unspecified: Secondary | ICD-10-CM | POA: Diagnosis not present

## 2020-02-28 DIAGNOSIS — D045 Carcinoma in situ of skin of trunk: Secondary | ICD-10-CM | POA: Diagnosis not present

## 2020-02-28 DIAGNOSIS — L570 Actinic keratosis: Secondary | ICD-10-CM | POA: Diagnosis not present

## 2020-02-28 DIAGNOSIS — D485 Neoplasm of uncertain behavior of skin: Secondary | ICD-10-CM | POA: Diagnosis not present

## 2020-03-06 DIAGNOSIS — C44529 Squamous cell carcinoma of skin of other part of trunk: Secondary | ICD-10-CM | POA: Diagnosis not present

## 2020-03-15 DIAGNOSIS — J441 Chronic obstructive pulmonary disease with (acute) exacerbation: Secondary | ICD-10-CM | POA: Diagnosis not present

## 2020-03-15 DIAGNOSIS — R0902 Hypoxemia: Secondary | ICD-10-CM | POA: Diagnosis not present

## 2020-03-15 DIAGNOSIS — G309 Alzheimer's disease, unspecified: Secondary | ICD-10-CM | POA: Diagnosis not present

## 2020-03-15 DIAGNOSIS — Z20822 Contact with and (suspected) exposure to covid-19: Secondary | ICD-10-CM | POA: Diagnosis not present

## 2020-03-15 DIAGNOSIS — E860 Dehydration: Secondary | ICD-10-CM | POA: Diagnosis not present

## 2020-03-15 DIAGNOSIS — M6281 Muscle weakness (generalized): Secondary | ICD-10-CM | POA: Diagnosis not present

## 2020-03-15 DIAGNOSIS — R404 Transient alteration of awareness: Secondary | ICD-10-CM | POA: Diagnosis not present

## 2020-03-15 DIAGNOSIS — J9601 Acute respiratory failure with hypoxia: Secondary | ICD-10-CM | POA: Diagnosis not present

## 2020-03-15 DIAGNOSIS — R519 Headache, unspecified: Secondary | ICD-10-CM | POA: Diagnosis not present

## 2020-03-15 DIAGNOSIS — C4490 Unspecified malignant neoplasm of skin, unspecified: Secondary | ICD-10-CM | POA: Diagnosis not present

## 2020-03-15 DIAGNOSIS — I959 Hypotension, unspecified: Secondary | ICD-10-CM | POA: Diagnosis not present

## 2020-03-15 DIAGNOSIS — N179 Acute kidney failure, unspecified: Secondary | ICD-10-CM | POA: Diagnosis not present

## 2020-03-15 DIAGNOSIS — Z7401 Bed confinement status: Secondary | ICD-10-CM | POA: Diagnosis not present

## 2020-03-15 DIAGNOSIS — Z743 Need for continuous supervision: Secondary | ICD-10-CM | POA: Diagnosis not present

## 2020-03-15 DIAGNOSIS — R0602 Shortness of breath: Secondary | ICD-10-CM | POA: Diagnosis not present

## 2020-03-15 DIAGNOSIS — Z87891 Personal history of nicotine dependence: Secondary | ICD-10-CM | POA: Diagnosis not present

## 2020-03-15 DIAGNOSIS — J9621 Acute and chronic respiratory failure with hypoxia: Secondary | ICD-10-CM | POA: Diagnosis not present

## 2020-03-15 DIAGNOSIS — D649 Anemia, unspecified: Secondary | ICD-10-CM | POA: Diagnosis not present

## 2020-03-15 DIAGNOSIS — Z96652 Presence of left artificial knee joint: Secondary | ICD-10-CM | POA: Diagnosis not present

## 2020-03-15 DIAGNOSIS — J8410 Pulmonary fibrosis, unspecified: Secondary | ICD-10-CM | POA: Diagnosis not present

## 2020-03-15 DIAGNOSIS — R410 Disorientation, unspecified: Secondary | ICD-10-CM | POA: Diagnosis not present

## 2020-03-15 DIAGNOSIS — J209 Acute bronchitis, unspecified: Secondary | ICD-10-CM | POA: Diagnosis not present

## 2020-03-15 DIAGNOSIS — J440 Chronic obstructive pulmonary disease with acute lower respiratory infection: Secondary | ICD-10-CM | POA: Diagnosis not present

## 2020-03-15 DIAGNOSIS — R531 Weakness: Secondary | ICD-10-CM | POA: Diagnosis not present

## 2020-03-15 DIAGNOSIS — Z66 Do not resuscitate: Secondary | ICD-10-CM | POA: Diagnosis not present

## 2020-03-23 DIAGNOSIS — Z7982 Long term (current) use of aspirin: Secondary | ICD-10-CM | POA: Diagnosis not present

## 2020-03-23 DIAGNOSIS — J8410 Pulmonary fibrosis, unspecified: Secondary | ICD-10-CM | POA: Diagnosis not present

## 2020-03-23 DIAGNOSIS — Z9981 Dependence on supplemental oxygen: Secondary | ICD-10-CM | POA: Diagnosis not present

## 2020-03-23 DIAGNOSIS — D649 Anemia, unspecified: Secondary | ICD-10-CM | POA: Diagnosis not present

## 2020-03-23 DIAGNOSIS — J439 Emphysema, unspecified: Secondary | ICD-10-CM | POA: Diagnosis not present

## 2020-03-23 DIAGNOSIS — Z85828 Personal history of other malignant neoplasm of skin: Secondary | ICD-10-CM | POA: Diagnosis not present

## 2020-03-23 DIAGNOSIS — Z87891 Personal history of nicotine dependence: Secondary | ICD-10-CM | POA: Diagnosis not present

## 2020-03-23 DIAGNOSIS — G309 Alzheimer's disease, unspecified: Secondary | ICD-10-CM | POA: Diagnosis not present

## 2020-03-23 DIAGNOSIS — Z7952 Long term (current) use of systemic steroids: Secondary | ICD-10-CM | POA: Diagnosis not present

## 2020-03-23 DIAGNOSIS — Z9181 History of falling: Secondary | ICD-10-CM | POA: Diagnosis not present

## 2020-03-23 DIAGNOSIS — Z96652 Presence of left artificial knee joint: Secondary | ICD-10-CM | POA: Diagnosis not present

## 2020-03-23 DIAGNOSIS — J9621 Acute and chronic respiratory failure with hypoxia: Secondary | ICD-10-CM | POA: Diagnosis not present

## 2020-03-26 DIAGNOSIS — Z7982 Long term (current) use of aspirin: Secondary | ICD-10-CM | POA: Diagnosis not present

## 2020-03-26 DIAGNOSIS — J8410 Pulmonary fibrosis, unspecified: Secondary | ICD-10-CM | POA: Diagnosis not present

## 2020-03-26 DIAGNOSIS — D649 Anemia, unspecified: Secondary | ICD-10-CM | POA: Diagnosis not present

## 2020-03-26 DIAGNOSIS — Z9981 Dependence on supplemental oxygen: Secondary | ICD-10-CM | POA: Diagnosis not present

## 2020-03-26 DIAGNOSIS — Z87891 Personal history of nicotine dependence: Secondary | ICD-10-CM | POA: Diagnosis not present

## 2020-03-26 DIAGNOSIS — J439 Emphysema, unspecified: Secondary | ICD-10-CM | POA: Diagnosis not present

## 2020-03-26 DIAGNOSIS — G309 Alzheimer's disease, unspecified: Secondary | ICD-10-CM | POA: Diagnosis not present

## 2020-03-26 DIAGNOSIS — J9621 Acute and chronic respiratory failure with hypoxia: Secondary | ICD-10-CM | POA: Diagnosis not present

## 2020-03-26 DIAGNOSIS — Z85828 Personal history of other malignant neoplasm of skin: Secondary | ICD-10-CM | POA: Diagnosis not present

## 2020-03-26 DIAGNOSIS — Z96652 Presence of left artificial knee joint: Secondary | ICD-10-CM | POA: Diagnosis not present

## 2020-03-26 DIAGNOSIS — Z9181 History of falling: Secondary | ICD-10-CM | POA: Diagnosis not present

## 2020-03-26 DIAGNOSIS — Z7952 Long term (current) use of systemic steroids: Secondary | ICD-10-CM | POA: Diagnosis not present

## 2020-03-27 DIAGNOSIS — R609 Edema, unspecified: Secondary | ICD-10-CM | POA: Diagnosis not present

## 2020-03-27 DIAGNOSIS — L03115 Cellulitis of right lower limb: Secondary | ICD-10-CM | POA: Diagnosis not present

## 2020-03-28 DIAGNOSIS — Z7982 Long term (current) use of aspirin: Secondary | ICD-10-CM | POA: Diagnosis not present

## 2020-03-28 DIAGNOSIS — Z96652 Presence of left artificial knee joint: Secondary | ICD-10-CM | POA: Diagnosis not present

## 2020-03-28 DIAGNOSIS — Z7952 Long term (current) use of systemic steroids: Secondary | ICD-10-CM | POA: Diagnosis not present

## 2020-03-28 DIAGNOSIS — J439 Emphysema, unspecified: Secondary | ICD-10-CM | POA: Diagnosis not present

## 2020-03-28 DIAGNOSIS — Z9181 History of falling: Secondary | ICD-10-CM | POA: Diagnosis not present

## 2020-03-28 DIAGNOSIS — D649 Anemia, unspecified: Secondary | ICD-10-CM | POA: Diagnosis not present

## 2020-03-28 DIAGNOSIS — Z87891 Personal history of nicotine dependence: Secondary | ICD-10-CM | POA: Diagnosis not present

## 2020-03-28 DIAGNOSIS — Z9981 Dependence on supplemental oxygen: Secondary | ICD-10-CM | POA: Diagnosis not present

## 2020-03-28 DIAGNOSIS — Z85828 Personal history of other malignant neoplasm of skin: Secondary | ICD-10-CM | POA: Diagnosis not present

## 2020-03-28 DIAGNOSIS — J9621 Acute and chronic respiratory failure with hypoxia: Secondary | ICD-10-CM | POA: Diagnosis not present

## 2020-03-28 DIAGNOSIS — J8410 Pulmonary fibrosis, unspecified: Secondary | ICD-10-CM | POA: Diagnosis not present

## 2020-03-28 DIAGNOSIS — G309 Alzheimer's disease, unspecified: Secondary | ICD-10-CM | POA: Diagnosis not present

## 2020-03-31 ENCOUNTER — Other Ambulatory Visit: Payer: Self-pay | Admitting: *Deleted

## 2020-03-31 DIAGNOSIS — Z7952 Long term (current) use of systemic steroids: Secondary | ICD-10-CM | POA: Diagnosis not present

## 2020-03-31 DIAGNOSIS — Z9981 Dependence on supplemental oxygen: Secondary | ICD-10-CM | POA: Diagnosis not present

## 2020-03-31 DIAGNOSIS — Z87891 Personal history of nicotine dependence: Secondary | ICD-10-CM | POA: Diagnosis not present

## 2020-03-31 DIAGNOSIS — J8410 Pulmonary fibrosis, unspecified: Secondary | ICD-10-CM | POA: Diagnosis not present

## 2020-03-31 DIAGNOSIS — J9621 Acute and chronic respiratory failure with hypoxia: Secondary | ICD-10-CM | POA: Diagnosis not present

## 2020-03-31 DIAGNOSIS — Z9181 History of falling: Secondary | ICD-10-CM | POA: Diagnosis not present

## 2020-03-31 DIAGNOSIS — G309 Alzheimer's disease, unspecified: Secondary | ICD-10-CM | POA: Diagnosis not present

## 2020-03-31 DIAGNOSIS — Z96652 Presence of left artificial knee joint: Secondary | ICD-10-CM | POA: Diagnosis not present

## 2020-03-31 DIAGNOSIS — J439 Emphysema, unspecified: Secondary | ICD-10-CM | POA: Diagnosis not present

## 2020-03-31 DIAGNOSIS — Z7982 Long term (current) use of aspirin: Secondary | ICD-10-CM | POA: Diagnosis not present

## 2020-03-31 DIAGNOSIS — D649 Anemia, unspecified: Secondary | ICD-10-CM | POA: Diagnosis not present

## 2020-03-31 DIAGNOSIS — Z85828 Personal history of other malignant neoplasm of skin: Secondary | ICD-10-CM | POA: Diagnosis not present

## 2020-03-31 NOTE — Patient Outreach (Signed)
[facility]) Care Management  03/31/2020  Eric Pratt Apr 24, 1935 [id number]   Telephone Assessment-Unsuccessful  RN attempted the initial outreach today however unsuccessful. RN able to leave a HIPAA approved voice message requesting a call back for pending services.   PLAN: Will attempted another outreach call in 4 business days. Will also send outreach letter to this pt.  Raina Mina, RN Care Management Coordinator [place] Office [phone]

## 2020-04-01 DIAGNOSIS — Z85828 Personal history of other malignant neoplasm of skin: Secondary | ICD-10-CM | POA: Diagnosis not present

## 2020-04-01 DIAGNOSIS — Z9181 History of falling: Secondary | ICD-10-CM | POA: Diagnosis not present

## 2020-04-01 DIAGNOSIS — Z7982 Long term (current) use of aspirin: Secondary | ICD-10-CM | POA: Diagnosis not present

## 2020-04-01 DIAGNOSIS — J8410 Pulmonary fibrosis, unspecified: Secondary | ICD-10-CM | POA: Diagnosis not present

## 2020-04-01 DIAGNOSIS — J9621 Acute and chronic respiratory failure with hypoxia: Secondary | ICD-10-CM | POA: Diagnosis not present

## 2020-04-01 DIAGNOSIS — Z9981 Dependence on supplemental oxygen: Secondary | ICD-10-CM | POA: Diagnosis not present

## 2020-04-01 DIAGNOSIS — J439 Emphysema, unspecified: Secondary | ICD-10-CM | POA: Diagnosis not present

## 2020-04-01 DIAGNOSIS — Z7952 Long term (current) use of systemic steroids: Secondary | ICD-10-CM | POA: Diagnosis not present

## 2020-04-01 DIAGNOSIS — Z87891 Personal history of nicotine dependence: Secondary | ICD-10-CM | POA: Diagnosis not present

## 2020-04-01 DIAGNOSIS — G309 Alzheimer's disease, unspecified: Secondary | ICD-10-CM | POA: Diagnosis not present

## 2020-04-01 DIAGNOSIS — Z96652 Presence of left artificial knee joint: Secondary | ICD-10-CM | POA: Diagnosis not present

## 2020-04-01 DIAGNOSIS — D649 Anemia, unspecified: Secondary | ICD-10-CM | POA: Diagnosis not present

## 2020-04-02 DIAGNOSIS — G309 Alzheimer's disease, unspecified: Secondary | ICD-10-CM | POA: Diagnosis not present

## 2020-04-02 DIAGNOSIS — Z9181 History of falling: Secondary | ICD-10-CM | POA: Diagnosis not present

## 2020-04-02 DIAGNOSIS — J8410 Pulmonary fibrosis, unspecified: Secondary | ICD-10-CM | POA: Diagnosis not present

## 2020-04-02 DIAGNOSIS — Z96652 Presence of left artificial knee joint: Secondary | ICD-10-CM | POA: Diagnosis not present

## 2020-04-02 DIAGNOSIS — Z85828 Personal history of other malignant neoplasm of skin: Secondary | ICD-10-CM | POA: Diagnosis not present

## 2020-04-02 DIAGNOSIS — Z9981 Dependence on supplemental oxygen: Secondary | ICD-10-CM | POA: Diagnosis not present

## 2020-04-02 DIAGNOSIS — Z7982 Long term (current) use of aspirin: Secondary | ICD-10-CM | POA: Diagnosis not present

## 2020-04-02 DIAGNOSIS — Z7952 Long term (current) use of systemic steroids: Secondary | ICD-10-CM | POA: Diagnosis not present

## 2020-04-02 DIAGNOSIS — J439 Emphysema, unspecified: Secondary | ICD-10-CM | POA: Diagnosis not present

## 2020-04-02 DIAGNOSIS — Z87891 Personal history of nicotine dependence: Secondary | ICD-10-CM | POA: Diagnosis not present

## 2020-04-02 DIAGNOSIS — J9621 Acute and chronic respiratory failure with hypoxia: Secondary | ICD-10-CM | POA: Diagnosis not present

## 2020-04-02 DIAGNOSIS — D649 Anemia, unspecified: Secondary | ICD-10-CM | POA: Diagnosis not present

## 2020-04-03 DIAGNOSIS — Z85828 Personal history of other malignant neoplasm of skin: Secondary | ICD-10-CM | POA: Diagnosis not present

## 2020-04-03 DIAGNOSIS — Z9981 Dependence on supplemental oxygen: Secondary | ICD-10-CM | POA: Diagnosis not present

## 2020-04-03 DIAGNOSIS — Z7952 Long term (current) use of systemic steroids: Secondary | ICD-10-CM | POA: Diagnosis not present

## 2020-04-03 DIAGNOSIS — Z96652 Presence of left artificial knee joint: Secondary | ICD-10-CM | POA: Diagnosis not present

## 2020-04-03 DIAGNOSIS — J439 Emphysema, unspecified: Secondary | ICD-10-CM | POA: Diagnosis not present

## 2020-04-03 DIAGNOSIS — D649 Anemia, unspecified: Secondary | ICD-10-CM | POA: Diagnosis not present

## 2020-04-03 DIAGNOSIS — Z87891 Personal history of nicotine dependence: Secondary | ICD-10-CM | POA: Diagnosis not present

## 2020-04-03 DIAGNOSIS — G309 Alzheimer's disease, unspecified: Secondary | ICD-10-CM | POA: Diagnosis not present

## 2020-04-03 DIAGNOSIS — Z7982 Long term (current) use of aspirin: Secondary | ICD-10-CM | POA: Diagnosis not present

## 2020-04-03 DIAGNOSIS — J8410 Pulmonary fibrosis, unspecified: Secondary | ICD-10-CM | POA: Diagnosis not present

## 2020-04-03 DIAGNOSIS — Z9181 History of falling: Secondary | ICD-10-CM | POA: Diagnosis not present

## 2020-04-03 DIAGNOSIS — J9621 Acute and chronic respiratory failure with hypoxia: Secondary | ICD-10-CM | POA: Diagnosis not present

## 2020-04-04 ENCOUNTER — Other Ambulatory Visit: Payer: Self-pay | Admitting: *Deleted

## 2020-04-04 NOTE — Patient Outreach (Signed)
[facility]) Care Management  04/04/2020  CORWIN KUIKEN [birth year] [id number]   Telephone Assessment (2nd outreach)  RN attempted another outreach call both available numbers however unsuccessful. RN only able to leave a HIPAA message on the pt's cell [phone]) requesting a call back.  Plan: Will further engage at that time on [facility] services. Will rescheduled another outreach call for pending services next week.  Raina Mina, RN Care Management Coordinator [place] Office [phone]

## 2020-04-07 DIAGNOSIS — J439 Emphysema, unspecified: Secondary | ICD-10-CM | POA: Diagnosis not present

## 2020-04-07 DIAGNOSIS — Z9181 History of falling: Secondary | ICD-10-CM | POA: Diagnosis not present

## 2020-04-07 DIAGNOSIS — Z96652 Presence of left artificial knee joint: Secondary | ICD-10-CM | POA: Diagnosis not present

## 2020-04-07 DIAGNOSIS — Z85828 Personal history of other malignant neoplasm of skin: Secondary | ICD-10-CM | POA: Diagnosis not present

## 2020-04-07 DIAGNOSIS — J8410 Pulmonary fibrosis, unspecified: Secondary | ICD-10-CM | POA: Diagnosis not present

## 2020-04-07 DIAGNOSIS — Z9981 Dependence on supplemental oxygen: Secondary | ICD-10-CM | POA: Diagnosis not present

## 2020-04-07 DIAGNOSIS — J9621 Acute and chronic respiratory failure with hypoxia: Secondary | ICD-10-CM | POA: Diagnosis not present

## 2020-04-07 DIAGNOSIS — G309 Alzheimer's disease, unspecified: Secondary | ICD-10-CM | POA: Diagnosis not present

## 2020-04-07 DIAGNOSIS — Z87891 Personal history of nicotine dependence: Secondary | ICD-10-CM | POA: Diagnosis not present

## 2020-04-07 DIAGNOSIS — Z7982 Long term (current) use of aspirin: Secondary | ICD-10-CM | POA: Diagnosis not present

## 2020-04-07 DIAGNOSIS — D649 Anemia, unspecified: Secondary | ICD-10-CM | POA: Diagnosis not present

## 2020-04-07 DIAGNOSIS — Z7952 Long term (current) use of systemic steroids: Secondary | ICD-10-CM | POA: Diagnosis not present

## 2020-04-08 DIAGNOSIS — J9621 Acute and chronic respiratory failure with hypoxia: Secondary | ICD-10-CM | POA: Diagnosis not present

## 2020-04-08 DIAGNOSIS — D649 Anemia, unspecified: Secondary | ICD-10-CM | POA: Diagnosis not present

## 2020-04-08 DIAGNOSIS — Z9981 Dependence on supplemental oxygen: Secondary | ICD-10-CM | POA: Diagnosis not present

## 2020-04-08 DIAGNOSIS — Z7982 Long term (current) use of aspirin: Secondary | ICD-10-CM | POA: Diagnosis not present

## 2020-04-08 DIAGNOSIS — Z87891 Personal history of nicotine dependence: Secondary | ICD-10-CM | POA: Diagnosis not present

## 2020-04-08 DIAGNOSIS — Z7952 Long term (current) use of systemic steroids: Secondary | ICD-10-CM | POA: Diagnosis not present

## 2020-04-08 DIAGNOSIS — J8410 Pulmonary fibrosis, unspecified: Secondary | ICD-10-CM | POA: Diagnosis not present

## 2020-04-08 DIAGNOSIS — Z96652 Presence of left artificial knee joint: Secondary | ICD-10-CM | POA: Diagnosis not present

## 2020-04-08 DIAGNOSIS — Z9181 History of falling: Secondary | ICD-10-CM | POA: Diagnosis not present

## 2020-04-08 DIAGNOSIS — J439 Emphysema, unspecified: Secondary | ICD-10-CM | POA: Diagnosis not present

## 2020-04-08 DIAGNOSIS — Z85828 Personal history of other malignant neoplasm of skin: Secondary | ICD-10-CM | POA: Diagnosis not present

## 2020-04-08 DIAGNOSIS — G309 Alzheimer's disease, unspecified: Secondary | ICD-10-CM | POA: Diagnosis not present

## 2020-04-09 ENCOUNTER — Other Ambulatory Visit: Payer: Self-pay | Admitting: *Deleted

## 2020-04-09 NOTE — Patient Outreach (Signed)
[facility]) Care Management  04/09/2020  XANDER JUTRAS [birth year] [id number]   Telephone Assessment-Successful  RN spoke with pt's wife (Martha)-pt unable to converse over the phone. Wife states pt is no longer with the Epic provide and now with a provider in [place], [place] (Dr Yetta Barre). RN case manager has explained the purpose for today's call and verified identifiers however pt is no longer eligible for [facility] services with the recent change in his provider.    Plan: Case will be closed.  Raina Mina, RN Care Management Coordinator [place] Office [phone]

## 2020-04-10 DIAGNOSIS — Z85828 Personal history of other malignant neoplasm of skin: Secondary | ICD-10-CM | POA: Diagnosis not present

## 2020-04-10 DIAGNOSIS — J8410 Pulmonary fibrosis, unspecified: Secondary | ICD-10-CM | POA: Diagnosis not present

## 2020-04-10 DIAGNOSIS — D649 Anemia, unspecified: Secondary | ICD-10-CM | POA: Diagnosis not present

## 2020-04-10 DIAGNOSIS — Z87891 Personal history of nicotine dependence: Secondary | ICD-10-CM | POA: Diagnosis not present

## 2020-04-10 DIAGNOSIS — Z96652 Presence of left artificial knee joint: Secondary | ICD-10-CM | POA: Diagnosis not present

## 2020-04-10 DIAGNOSIS — J439 Emphysema, unspecified: Secondary | ICD-10-CM | POA: Diagnosis not present

## 2020-04-10 DIAGNOSIS — Z9981 Dependence on supplemental oxygen: Secondary | ICD-10-CM | POA: Diagnosis not present

## 2020-04-10 DIAGNOSIS — Z7982 Long term (current) use of aspirin: Secondary | ICD-10-CM | POA: Diagnosis not present

## 2020-04-10 DIAGNOSIS — Z7952 Long term (current) use of systemic steroids: Secondary | ICD-10-CM | POA: Diagnosis not present

## 2020-04-10 DIAGNOSIS — J9621 Acute and chronic respiratory failure with hypoxia: Secondary | ICD-10-CM | POA: Diagnosis not present

## 2020-04-10 DIAGNOSIS — Z9181 History of falling: Secondary | ICD-10-CM | POA: Diagnosis not present

## 2020-04-10 DIAGNOSIS — G309 Alzheimer's disease, unspecified: Secondary | ICD-10-CM | POA: Diagnosis not present

## 2020-04-11 DIAGNOSIS — J8410 Pulmonary fibrosis, unspecified: Secondary | ICD-10-CM | POA: Diagnosis not present

## 2020-04-11 DIAGNOSIS — J439 Emphysema, unspecified: Secondary | ICD-10-CM | POA: Diagnosis not present

## 2020-04-11 DIAGNOSIS — Z7982 Long term (current) use of aspirin: Secondary | ICD-10-CM | POA: Diagnosis not present

## 2020-04-11 DIAGNOSIS — J9621 Acute and chronic respiratory failure with hypoxia: Secondary | ICD-10-CM | POA: Diagnosis not present

## 2020-04-11 DIAGNOSIS — Z9981 Dependence on supplemental oxygen: Secondary | ICD-10-CM | POA: Diagnosis not present

## 2020-04-11 DIAGNOSIS — Z96652 Presence of left artificial knee joint: Secondary | ICD-10-CM | POA: Diagnosis not present

## 2020-04-11 DIAGNOSIS — Z87891 Personal history of nicotine dependence: Secondary | ICD-10-CM | POA: Diagnosis not present

## 2020-04-11 DIAGNOSIS — G309 Alzheimer's disease, unspecified: Secondary | ICD-10-CM | POA: Diagnosis not present

## 2020-04-11 DIAGNOSIS — Z7952 Long term (current) use of systemic steroids: Secondary | ICD-10-CM | POA: Diagnosis not present

## 2020-04-11 DIAGNOSIS — D649 Anemia, unspecified: Secondary | ICD-10-CM | POA: Diagnosis not present

## 2020-04-11 DIAGNOSIS — Z85828 Personal history of other malignant neoplasm of skin: Secondary | ICD-10-CM | POA: Diagnosis not present

## 2020-04-11 DIAGNOSIS — Z9181 History of falling: Secondary | ICD-10-CM | POA: Diagnosis not present

## 2020-04-16 DIAGNOSIS — Z85828 Personal history of other malignant neoplasm of skin: Secondary | ICD-10-CM | POA: Diagnosis not present

## 2020-04-16 DIAGNOSIS — D649 Anemia, unspecified: Secondary | ICD-10-CM | POA: Diagnosis not present

## 2020-04-16 DIAGNOSIS — J9621 Acute and chronic respiratory failure with hypoxia: Secondary | ICD-10-CM | POA: Diagnosis not present

## 2020-04-16 DIAGNOSIS — Z96652 Presence of left artificial knee joint: Secondary | ICD-10-CM | POA: Diagnosis not present

## 2020-04-16 DIAGNOSIS — Z9181 History of falling: Secondary | ICD-10-CM | POA: Diagnosis not present

## 2020-04-16 DIAGNOSIS — J8410 Pulmonary fibrosis, unspecified: Secondary | ICD-10-CM | POA: Diagnosis not present

## 2020-04-16 DIAGNOSIS — J439 Emphysema, unspecified: Secondary | ICD-10-CM | POA: Diagnosis not present

## 2020-04-16 DIAGNOSIS — Z7952 Long term (current) use of systemic steroids: Secondary | ICD-10-CM | POA: Diagnosis not present

## 2020-04-16 DIAGNOSIS — Z7982 Long term (current) use of aspirin: Secondary | ICD-10-CM | POA: Diagnosis not present

## 2020-04-16 DIAGNOSIS — G309 Alzheimer's disease, unspecified: Secondary | ICD-10-CM | POA: Diagnosis not present

## 2020-04-16 DIAGNOSIS — Z87891 Personal history of nicotine dependence: Secondary | ICD-10-CM | POA: Diagnosis not present

## 2020-04-16 DIAGNOSIS — Z9981 Dependence on supplemental oxygen: Secondary | ICD-10-CM | POA: Diagnosis not present

## 2020-04-18 DIAGNOSIS — J8410 Pulmonary fibrosis, unspecified: Secondary | ICD-10-CM | POA: Diagnosis not present

## 2020-04-21 DIAGNOSIS — M6281 Muscle weakness (generalized): Secondary | ICD-10-CM | POA: Diagnosis not present

## 2020-04-21 DIAGNOSIS — G309 Alzheimer's disease, unspecified: Secondary | ICD-10-CM | POA: Diagnosis not present

## 2020-04-21 DIAGNOSIS — J441 Chronic obstructive pulmonary disease with (acute) exacerbation: Secondary | ICD-10-CM | POA: Diagnosis not present

## 2020-04-21 DIAGNOSIS — J9601 Acute respiratory failure with hypoxia: Secondary | ICD-10-CM | POA: Diagnosis not present

## 2020-04-21 DIAGNOSIS — J8410 Pulmonary fibrosis, unspecified: Secondary | ICD-10-CM | POA: Diagnosis not present

## 2020-04-22 DIAGNOSIS — L03119 Cellulitis of unspecified part of limb: Secondary | ICD-10-CM | POA: Diagnosis not present

## 2020-04-22 DIAGNOSIS — M109 Gout, unspecified: Secondary | ICD-10-CM | POA: Diagnosis not present

## 2020-04-23 DIAGNOSIS — Z7982 Long term (current) use of aspirin: Secondary | ICD-10-CM | POA: Diagnosis not present

## 2020-04-23 DIAGNOSIS — Z96652 Presence of left artificial knee joint: Secondary | ICD-10-CM | POA: Diagnosis not present

## 2020-04-23 DIAGNOSIS — Z9981 Dependence on supplemental oxygen: Secondary | ICD-10-CM | POA: Diagnosis not present

## 2020-04-23 DIAGNOSIS — Z85828 Personal history of other malignant neoplasm of skin: Secondary | ICD-10-CM | POA: Diagnosis not present

## 2020-04-23 DIAGNOSIS — D649 Anemia, unspecified: Secondary | ICD-10-CM | POA: Diagnosis not present

## 2020-04-23 DIAGNOSIS — J8410 Pulmonary fibrosis, unspecified: Secondary | ICD-10-CM | POA: Diagnosis not present

## 2020-04-23 DIAGNOSIS — Z7952 Long term (current) use of systemic steroids: Secondary | ICD-10-CM | POA: Diagnosis not present

## 2020-04-23 DIAGNOSIS — Z9181 History of falling: Secondary | ICD-10-CM | POA: Diagnosis not present

## 2020-04-23 DIAGNOSIS — J9621 Acute and chronic respiratory failure with hypoxia: Secondary | ICD-10-CM | POA: Diagnosis not present

## 2020-04-23 DIAGNOSIS — J439 Emphysema, unspecified: Secondary | ICD-10-CM | POA: Diagnosis not present

## 2020-04-23 DIAGNOSIS — Z87891 Personal history of nicotine dependence: Secondary | ICD-10-CM | POA: Diagnosis not present

## 2020-04-23 DIAGNOSIS — G309 Alzheimer's disease, unspecified: Secondary | ICD-10-CM | POA: Diagnosis not present

## 2020-05-19 DIAGNOSIS — J8410 Pulmonary fibrosis, unspecified: Secondary | ICD-10-CM | POA: Diagnosis not present

## 2020-05-22 DIAGNOSIS — J9601 Acute respiratory failure with hypoxia: Secondary | ICD-10-CM | POA: Diagnosis not present

## 2020-05-22 DIAGNOSIS — J8410 Pulmonary fibrosis, unspecified: Secondary | ICD-10-CM | POA: Diagnosis not present

## 2020-05-22 DIAGNOSIS — M6281 Muscle weakness (generalized): Secondary | ICD-10-CM | POA: Diagnosis not present

## 2020-06-10 DIAGNOSIS — J441 Chronic obstructive pulmonary disease with (acute) exacerbation: Secondary | ICD-10-CM | POA: Diagnosis not present

## 2020-06-10 DIAGNOSIS — G309 Alzheimer's disease, unspecified: Secondary | ICD-10-CM | POA: Diagnosis not present

## 2020-06-10 DIAGNOSIS — R509 Fever, unspecified: Secondary | ICD-10-CM | POA: Diagnosis not present

## 2020-06-10 DIAGNOSIS — J449 Chronic obstructive pulmonary disease, unspecified: Secondary | ICD-10-CM | POA: Diagnosis not present

## 2020-06-19 DIAGNOSIS — J8410 Pulmonary fibrosis, unspecified: Secondary | ICD-10-CM | POA: Diagnosis not present

## 2020-06-22 DIAGNOSIS — M6281 Muscle weakness (generalized): Secondary | ICD-10-CM | POA: Diagnosis not present

## 2020-06-22 DIAGNOSIS — J8410 Pulmonary fibrosis, unspecified: Secondary | ICD-10-CM | POA: Diagnosis not present

## 2020-06-22 DIAGNOSIS — J9601 Acute respiratory failure with hypoxia: Secondary | ICD-10-CM | POA: Diagnosis not present

## 2020-07-03 DIAGNOSIS — R300 Dysuria: Secondary | ICD-10-CM | POA: Diagnosis not present

## 2020-07-19 DIAGNOSIS — J8410 Pulmonary fibrosis, unspecified: Secondary | ICD-10-CM | POA: Diagnosis not present

## 2020-07-22 DIAGNOSIS — M6281 Muscle weakness (generalized): Secondary | ICD-10-CM | POA: Diagnosis not present

## 2020-07-22 DIAGNOSIS — J8410 Pulmonary fibrosis, unspecified: Secondary | ICD-10-CM | POA: Diagnosis not present

## 2020-07-22 DIAGNOSIS — J9601 Acute respiratory failure with hypoxia: Secondary | ICD-10-CM | POA: Diagnosis not present

## 2020-08-04 DIAGNOSIS — Z8673 Personal history of transient ischemic attack (TIA), and cerebral infarction without residual deficits: Secondary | ICD-10-CM | POA: Diagnosis not present

## 2020-08-04 DIAGNOSIS — E782 Mixed hyperlipidemia: Secondary | ICD-10-CM | POA: Diagnosis not present

## 2020-08-04 DIAGNOSIS — G309 Alzheimer's disease, unspecified: Secondary | ICD-10-CM | POA: Diagnosis not present

## 2020-08-04 DIAGNOSIS — J449 Chronic obstructive pulmonary disease, unspecified: Secondary | ICD-10-CM | POA: Diagnosis not present

## 2020-08-04 DIAGNOSIS — Z1329 Encounter for screening for other suspected endocrine disorder: Secondary | ICD-10-CM | POA: Diagnosis not present

## 2020-08-04 DIAGNOSIS — Z13220 Encounter for screening for lipoid disorders: Secondary | ICD-10-CM | POA: Diagnosis not present

## 2020-08-08 DIAGNOSIS — G309 Alzheimer's disease, unspecified: Secondary | ICD-10-CM | POA: Diagnosis not present

## 2020-08-08 DIAGNOSIS — Z23 Encounter for immunization: Secondary | ICD-10-CM | POA: Diagnosis not present

## 2020-08-08 DIAGNOSIS — J9610 Chronic respiratory failure, unspecified whether with hypoxia or hypercapnia: Secondary | ICD-10-CM | POA: Diagnosis not present

## 2020-08-08 DIAGNOSIS — J449 Chronic obstructive pulmonary disease, unspecified: Secondary | ICD-10-CM | POA: Diagnosis not present

## 2020-08-08 DIAGNOSIS — L409 Psoriasis, unspecified: Secondary | ICD-10-CM | POA: Diagnosis not present

## 2020-08-19 DIAGNOSIS — J8410 Pulmonary fibrosis, unspecified: Secondary | ICD-10-CM | POA: Diagnosis not present

## 2020-08-22 DIAGNOSIS — M6281 Muscle weakness (generalized): Secondary | ICD-10-CM | POA: Diagnosis not present

## 2020-08-22 DIAGNOSIS — J9601 Acute respiratory failure with hypoxia: Secondary | ICD-10-CM | POA: Diagnosis not present

## 2020-08-22 DIAGNOSIS — J8410 Pulmonary fibrosis, unspecified: Secondary | ICD-10-CM | POA: Diagnosis not present

## 2020-08-28 DIAGNOSIS — J8410 Pulmonary fibrosis, unspecified: Secondary | ICD-10-CM | POA: Diagnosis not present

## 2020-09-18 DIAGNOSIS — J8410 Pulmonary fibrosis, unspecified: Secondary | ICD-10-CM | POA: Diagnosis not present

## 2020-09-21 DIAGNOSIS — M6281 Muscle weakness (generalized): Secondary | ICD-10-CM | POA: Diagnosis not present

## 2020-09-21 DIAGNOSIS — J8410 Pulmonary fibrosis, unspecified: Secondary | ICD-10-CM | POA: Diagnosis not present

## 2020-09-21 DIAGNOSIS — J9601 Acute respiratory failure with hypoxia: Secondary | ICD-10-CM | POA: Diagnosis not present

## 2020-10-06 DIAGNOSIS — J441 Chronic obstructive pulmonary disease with (acute) exacerbation: Secondary | ICD-10-CM | POA: Diagnosis not present

## 2020-10-17 DIAGNOSIS — G309 Alzheimer's disease, unspecified: Secondary | ICD-10-CM | POA: Diagnosis not present

## 2020-10-17 DIAGNOSIS — M1990 Unspecified osteoarthritis, unspecified site: Secondary | ICD-10-CM | POA: Diagnosis not present

## 2020-10-19 DIAGNOSIS — J8410 Pulmonary fibrosis, unspecified: Secondary | ICD-10-CM | POA: Diagnosis not present

## 2020-10-22 DIAGNOSIS — M6281 Muscle weakness (generalized): Secondary | ICD-10-CM | POA: Diagnosis not present

## 2020-10-22 DIAGNOSIS — J9601 Acute respiratory failure with hypoxia: Secondary | ICD-10-CM | POA: Diagnosis not present

## 2020-10-22 DIAGNOSIS — J8410 Pulmonary fibrosis, unspecified: Secondary | ICD-10-CM | POA: Diagnosis not present

## 2020-11-07 DIAGNOSIS — I10 Essential (primary) hypertension: Secondary | ICD-10-CM | POA: Diagnosis not present

## 2020-11-07 DIAGNOSIS — Z96659 Presence of unspecified artificial knee joint: Secondary | ICD-10-CM | POA: Diagnosis not present

## 2020-11-07 DIAGNOSIS — M6281 Muscle weakness (generalized): Secondary | ICD-10-CM | POA: Diagnosis not present

## 2020-11-07 DIAGNOSIS — Z9981 Dependence on supplemental oxygen: Secondary | ICD-10-CM | POA: Diagnosis not present

## 2020-11-07 DIAGNOSIS — Z87891 Personal history of nicotine dependence: Secondary | ICD-10-CM | POA: Diagnosis not present

## 2020-11-07 DIAGNOSIS — Z7952 Long term (current) use of systemic steroids: Secondary | ICD-10-CM | POA: Diagnosis not present

## 2020-11-07 DIAGNOSIS — Z7982 Long term (current) use of aspirin: Secondary | ICD-10-CM | POA: Diagnosis not present

## 2020-11-07 DIAGNOSIS — J449 Chronic obstructive pulmonary disease, unspecified: Secondary | ICD-10-CM | POA: Diagnosis not present

## 2020-11-07 DIAGNOSIS — Z993 Dependence on wheelchair: Secondary | ICD-10-CM | POA: Diagnosis not present

## 2020-11-08 DIAGNOSIS — Z96659 Presence of unspecified artificial knee joint: Secondary | ICD-10-CM | POA: Diagnosis not present

## 2020-11-08 DIAGNOSIS — Z9981 Dependence on supplemental oxygen: Secondary | ICD-10-CM | POA: Diagnosis not present

## 2020-11-08 DIAGNOSIS — Z87891 Personal history of nicotine dependence: Secondary | ICD-10-CM | POA: Diagnosis not present

## 2020-11-08 DIAGNOSIS — Z993 Dependence on wheelchair: Secondary | ICD-10-CM | POA: Diagnosis not present

## 2020-11-08 DIAGNOSIS — I10 Essential (primary) hypertension: Secondary | ICD-10-CM | POA: Diagnosis not present

## 2020-11-08 DIAGNOSIS — M6281 Muscle weakness (generalized): Secondary | ICD-10-CM | POA: Diagnosis not present

## 2020-11-08 DIAGNOSIS — J449 Chronic obstructive pulmonary disease, unspecified: Secondary | ICD-10-CM | POA: Diagnosis not present

## 2020-11-08 DIAGNOSIS — Z7982 Long term (current) use of aspirin: Secondary | ICD-10-CM | POA: Diagnosis not present

## 2020-11-08 DIAGNOSIS — Z7952 Long term (current) use of systemic steroids: Secondary | ICD-10-CM | POA: Diagnosis not present

## 2020-11-10 DIAGNOSIS — Z87891 Personal history of nicotine dependence: Secondary | ICD-10-CM | POA: Diagnosis not present

## 2020-11-10 DIAGNOSIS — I10 Essential (primary) hypertension: Secondary | ICD-10-CM | POA: Diagnosis not present

## 2020-11-10 DIAGNOSIS — Z7952 Long term (current) use of systemic steroids: Secondary | ICD-10-CM | POA: Diagnosis not present

## 2020-11-10 DIAGNOSIS — J449 Chronic obstructive pulmonary disease, unspecified: Secondary | ICD-10-CM | POA: Diagnosis not present

## 2020-11-10 DIAGNOSIS — Z96659 Presence of unspecified artificial knee joint: Secondary | ICD-10-CM | POA: Diagnosis not present

## 2020-11-10 DIAGNOSIS — Z993 Dependence on wheelchair: Secondary | ICD-10-CM | POA: Diagnosis not present

## 2020-11-10 DIAGNOSIS — Z7982 Long term (current) use of aspirin: Secondary | ICD-10-CM | POA: Diagnosis not present

## 2020-11-10 DIAGNOSIS — Z9981 Dependence on supplemental oxygen: Secondary | ICD-10-CM | POA: Diagnosis not present

## 2020-11-10 DIAGNOSIS — M6281 Muscle weakness (generalized): Secondary | ICD-10-CM | POA: Diagnosis not present

## 2020-11-11 DIAGNOSIS — M6281 Muscle weakness (generalized): Secondary | ICD-10-CM | POA: Diagnosis not present

## 2020-11-11 DIAGNOSIS — Z7982 Long term (current) use of aspirin: Secondary | ICD-10-CM | POA: Diagnosis not present

## 2020-11-11 DIAGNOSIS — Z7952 Long term (current) use of systemic steroids: Secondary | ICD-10-CM | POA: Diagnosis not present

## 2020-11-11 DIAGNOSIS — Z87891 Personal history of nicotine dependence: Secondary | ICD-10-CM | POA: Diagnosis not present

## 2020-11-11 DIAGNOSIS — Z96659 Presence of unspecified artificial knee joint: Secondary | ICD-10-CM | POA: Diagnosis not present

## 2020-11-11 DIAGNOSIS — Z993 Dependence on wheelchair: Secondary | ICD-10-CM | POA: Diagnosis not present

## 2020-11-11 DIAGNOSIS — I10 Essential (primary) hypertension: Secondary | ICD-10-CM | POA: Diagnosis not present

## 2020-11-11 DIAGNOSIS — J449 Chronic obstructive pulmonary disease, unspecified: Secondary | ICD-10-CM | POA: Diagnosis not present

## 2020-11-11 DIAGNOSIS — Z9981 Dependence on supplemental oxygen: Secondary | ICD-10-CM | POA: Diagnosis not present

## 2020-11-12 DIAGNOSIS — I10 Essential (primary) hypertension: Secondary | ICD-10-CM | POA: Diagnosis not present

## 2020-11-12 DIAGNOSIS — Z87891 Personal history of nicotine dependence: Secondary | ICD-10-CM | POA: Diagnosis not present

## 2020-11-12 DIAGNOSIS — M6281 Muscle weakness (generalized): Secondary | ICD-10-CM | POA: Diagnosis not present

## 2020-11-12 DIAGNOSIS — Z96659 Presence of unspecified artificial knee joint: Secondary | ICD-10-CM | POA: Diagnosis not present

## 2020-11-12 DIAGNOSIS — J449 Chronic obstructive pulmonary disease, unspecified: Secondary | ICD-10-CM | POA: Diagnosis not present

## 2020-11-12 DIAGNOSIS — Z7952 Long term (current) use of systemic steroids: Secondary | ICD-10-CM | POA: Diagnosis not present

## 2020-11-12 DIAGNOSIS — Z993 Dependence on wheelchair: Secondary | ICD-10-CM | POA: Diagnosis not present

## 2020-11-12 DIAGNOSIS — Z7982 Long term (current) use of aspirin: Secondary | ICD-10-CM | POA: Diagnosis not present

## 2020-11-12 DIAGNOSIS — Z9981 Dependence on supplemental oxygen: Secondary | ICD-10-CM | POA: Diagnosis not present

## 2020-11-14 ENCOUNTER — Telehealth: Payer: Self-pay

## 2020-11-14 DIAGNOSIS — I10 Essential (primary) hypertension: Secondary | ICD-10-CM | POA: Diagnosis not present

## 2020-11-14 DIAGNOSIS — Z7982 Long term (current) use of aspirin: Secondary | ICD-10-CM | POA: Diagnosis not present

## 2020-11-14 DIAGNOSIS — Z993 Dependence on wheelchair: Secondary | ICD-10-CM | POA: Diagnosis not present

## 2020-11-14 DIAGNOSIS — J449 Chronic obstructive pulmonary disease, unspecified: Secondary | ICD-10-CM | POA: Diagnosis not present

## 2020-11-14 DIAGNOSIS — Z96659 Presence of unspecified artificial knee joint: Secondary | ICD-10-CM | POA: Diagnosis not present

## 2020-11-14 DIAGNOSIS — Z9981 Dependence on supplemental oxygen: Secondary | ICD-10-CM | POA: Diagnosis not present

## 2020-11-14 DIAGNOSIS — Z87891 Personal history of nicotine dependence: Secondary | ICD-10-CM | POA: Diagnosis not present

## 2020-11-14 DIAGNOSIS — M6281 Muscle weakness (generalized): Secondary | ICD-10-CM | POA: Diagnosis not present

## 2020-11-14 DIAGNOSIS — Z7952 Long term (current) use of systemic steroids: Secondary | ICD-10-CM | POA: Diagnosis not present

## 2020-11-14 NOTE — Telephone Encounter (Signed)
Received Palliative Care referral from Dr. Gar Ponto.

## 2020-11-17 ENCOUNTER — Telehealth: Payer: Self-pay

## 2020-11-17 DIAGNOSIS — G309 Alzheimer's disease, unspecified: Secondary | ICD-10-CM | POA: Diagnosis not present

## 2020-11-17 DIAGNOSIS — J449 Chronic obstructive pulmonary disease, unspecified: Secondary | ICD-10-CM | POA: Diagnosis not present

## 2020-11-17 DIAGNOSIS — Z87891 Personal history of nicotine dependence: Secondary | ICD-10-CM | POA: Diagnosis not present

## 2020-11-17 DIAGNOSIS — I10 Essential (primary) hypertension: Secondary | ICD-10-CM | POA: Diagnosis not present

## 2020-11-17 DIAGNOSIS — M6281 Muscle weakness (generalized): Secondary | ICD-10-CM | POA: Diagnosis not present

## 2020-11-17 DIAGNOSIS — M1990 Unspecified osteoarthritis, unspecified site: Secondary | ICD-10-CM | POA: Diagnosis not present

## 2020-11-17 DIAGNOSIS — Z993 Dependence on wheelchair: Secondary | ICD-10-CM | POA: Diagnosis not present

## 2020-11-17 DIAGNOSIS — Z9981 Dependence on supplemental oxygen: Secondary | ICD-10-CM | POA: Diagnosis not present

## 2020-11-17 DIAGNOSIS — Z7952 Long term (current) use of systemic steroids: Secondary | ICD-10-CM | POA: Diagnosis not present

## 2020-11-17 DIAGNOSIS — Z7982 Long term (current) use of aspirin: Secondary | ICD-10-CM | POA: Diagnosis not present

## 2020-11-17 DIAGNOSIS — Z96659 Presence of unspecified artificial knee joint: Secondary | ICD-10-CM | POA: Diagnosis not present

## 2020-11-17 NOTE — Telephone Encounter (Signed)
Attempted to contact patient's grandson Barnabas Lister to schedule a Palliative Care consult appointment. No answer and unable to leave a message. Mailbox is full.

## 2020-11-18 DIAGNOSIS — Z7952 Long term (current) use of systemic steroids: Secondary | ICD-10-CM | POA: Diagnosis not present

## 2020-11-18 DIAGNOSIS — M6281 Muscle weakness (generalized): Secondary | ICD-10-CM | POA: Diagnosis not present

## 2020-11-18 DIAGNOSIS — Z96659 Presence of unspecified artificial knee joint: Secondary | ICD-10-CM | POA: Diagnosis not present

## 2020-11-18 DIAGNOSIS — Z87891 Personal history of nicotine dependence: Secondary | ICD-10-CM | POA: Diagnosis not present

## 2020-11-18 DIAGNOSIS — Z7982 Long term (current) use of aspirin: Secondary | ICD-10-CM | POA: Diagnosis not present

## 2020-11-18 DIAGNOSIS — J449 Chronic obstructive pulmonary disease, unspecified: Secondary | ICD-10-CM | POA: Diagnosis not present

## 2020-11-18 DIAGNOSIS — Z9981 Dependence on supplemental oxygen: Secondary | ICD-10-CM | POA: Diagnosis not present

## 2020-11-18 DIAGNOSIS — Z993 Dependence on wheelchair: Secondary | ICD-10-CM | POA: Diagnosis not present

## 2020-11-18 DIAGNOSIS — I10 Essential (primary) hypertension: Secondary | ICD-10-CM | POA: Diagnosis not present

## 2020-11-19 DIAGNOSIS — I10 Essential (primary) hypertension: Secondary | ICD-10-CM | POA: Diagnosis not present

## 2020-11-19 DIAGNOSIS — M6281 Muscle weakness (generalized): Secondary | ICD-10-CM | POA: Diagnosis not present

## 2020-11-19 DIAGNOSIS — J8410 Pulmonary fibrosis, unspecified: Secondary | ICD-10-CM | POA: Diagnosis not present

## 2020-11-19 DIAGNOSIS — Z87891 Personal history of nicotine dependence: Secondary | ICD-10-CM | POA: Diagnosis not present

## 2020-11-19 DIAGNOSIS — Z7982 Long term (current) use of aspirin: Secondary | ICD-10-CM | POA: Diagnosis not present

## 2020-11-19 DIAGNOSIS — Z993 Dependence on wheelchair: Secondary | ICD-10-CM | POA: Diagnosis not present

## 2020-11-19 DIAGNOSIS — Z96659 Presence of unspecified artificial knee joint: Secondary | ICD-10-CM | POA: Diagnosis not present

## 2020-11-19 DIAGNOSIS — J449 Chronic obstructive pulmonary disease, unspecified: Secondary | ICD-10-CM | POA: Diagnosis not present

## 2020-11-19 DIAGNOSIS — L03115 Cellulitis of right lower limb: Secondary | ICD-10-CM | POA: Diagnosis not present

## 2020-11-19 DIAGNOSIS — Z9981 Dependence on supplemental oxygen: Secondary | ICD-10-CM | POA: Diagnosis not present

## 2020-11-19 DIAGNOSIS — Z7952 Long term (current) use of systemic steroids: Secondary | ICD-10-CM | POA: Diagnosis not present

## 2020-11-20 ENCOUNTER — Telehealth: Payer: Self-pay

## 2020-11-20 DIAGNOSIS — M6281 Muscle weakness (generalized): Secondary | ICD-10-CM | POA: Diagnosis not present

## 2020-11-20 DIAGNOSIS — Z96659 Presence of unspecified artificial knee joint: Secondary | ICD-10-CM | POA: Diagnosis not present

## 2020-11-20 DIAGNOSIS — Z87891 Personal history of nicotine dependence: Secondary | ICD-10-CM | POA: Diagnosis not present

## 2020-11-20 DIAGNOSIS — Z993 Dependence on wheelchair: Secondary | ICD-10-CM | POA: Diagnosis not present

## 2020-11-20 DIAGNOSIS — Z7982 Long term (current) use of aspirin: Secondary | ICD-10-CM | POA: Diagnosis not present

## 2020-11-20 DIAGNOSIS — Z9981 Dependence on supplemental oxygen: Secondary | ICD-10-CM | POA: Diagnosis not present

## 2020-11-20 DIAGNOSIS — Z7952 Long term (current) use of systemic steroids: Secondary | ICD-10-CM | POA: Diagnosis not present

## 2020-11-20 DIAGNOSIS — I10 Essential (primary) hypertension: Secondary | ICD-10-CM | POA: Diagnosis not present

## 2020-11-20 DIAGNOSIS — J449 Chronic obstructive pulmonary disease, unspecified: Secondary | ICD-10-CM | POA: Diagnosis not present

## 2020-11-20 NOTE — Telephone Encounter (Signed)
Attempted to contact patient's grandson Barnabas Lister to schedule a Palliative Care consult appointment. No answer and unable to leave a message.

## 2020-11-21 DIAGNOSIS — L0291 Cutaneous abscess, unspecified: Secondary | ICD-10-CM | POA: Diagnosis not present

## 2020-11-21 DIAGNOSIS — L03119 Cellulitis of unspecified part of limb: Secondary | ICD-10-CM | POA: Diagnosis not present

## 2020-11-22 DIAGNOSIS — J9601 Acute respiratory failure with hypoxia: Secondary | ICD-10-CM | POA: Diagnosis not present

## 2020-11-22 DIAGNOSIS — L0291 Cutaneous abscess, unspecified: Secondary | ICD-10-CM | POA: Diagnosis not present

## 2020-11-22 DIAGNOSIS — M6281 Muscle weakness (generalized): Secondary | ICD-10-CM | POA: Diagnosis not present

## 2020-11-22 DIAGNOSIS — J8410 Pulmonary fibrosis, unspecified: Secondary | ICD-10-CM | POA: Diagnosis not present

## 2020-11-22 DIAGNOSIS — L03119 Cellulitis of unspecified part of limb: Secondary | ICD-10-CM | POA: Diagnosis not present

## 2020-11-24 DIAGNOSIS — M6281 Muscle weakness (generalized): Secondary | ICD-10-CM | POA: Diagnosis not present

## 2020-11-24 DIAGNOSIS — J449 Chronic obstructive pulmonary disease, unspecified: Secondary | ICD-10-CM | POA: Diagnosis not present

## 2020-11-24 DIAGNOSIS — Z87891 Personal history of nicotine dependence: Secondary | ICD-10-CM | POA: Diagnosis not present

## 2020-11-24 DIAGNOSIS — Z96659 Presence of unspecified artificial knee joint: Secondary | ICD-10-CM | POA: Diagnosis not present

## 2020-11-24 DIAGNOSIS — Z9981 Dependence on supplemental oxygen: Secondary | ICD-10-CM | POA: Diagnosis not present

## 2020-11-24 DIAGNOSIS — Z7982 Long term (current) use of aspirin: Secondary | ICD-10-CM | POA: Diagnosis not present

## 2020-11-24 DIAGNOSIS — Z7952 Long term (current) use of systemic steroids: Secondary | ICD-10-CM | POA: Diagnosis not present

## 2020-11-24 DIAGNOSIS — Z993 Dependence on wheelchair: Secondary | ICD-10-CM | POA: Diagnosis not present

## 2020-11-24 DIAGNOSIS — I10 Essential (primary) hypertension: Secondary | ICD-10-CM | POA: Diagnosis not present

## 2020-11-25 ENCOUNTER — Telehealth: Payer: Self-pay

## 2020-11-25 DIAGNOSIS — Z993 Dependence on wheelchair: Secondary | ICD-10-CM | POA: Diagnosis not present

## 2020-11-25 DIAGNOSIS — Z9981 Dependence on supplemental oxygen: Secondary | ICD-10-CM | POA: Diagnosis not present

## 2020-11-25 DIAGNOSIS — J449 Chronic obstructive pulmonary disease, unspecified: Secondary | ICD-10-CM | POA: Diagnosis not present

## 2020-11-25 DIAGNOSIS — Z7982 Long term (current) use of aspirin: Secondary | ICD-10-CM | POA: Diagnosis not present

## 2020-11-25 DIAGNOSIS — M6281 Muscle weakness (generalized): Secondary | ICD-10-CM | POA: Diagnosis not present

## 2020-11-25 DIAGNOSIS — I10 Essential (primary) hypertension: Secondary | ICD-10-CM | POA: Diagnosis not present

## 2020-11-25 DIAGNOSIS — Z87891 Personal history of nicotine dependence: Secondary | ICD-10-CM | POA: Diagnosis not present

## 2020-11-25 DIAGNOSIS — Z7952 Long term (current) use of systemic steroids: Secondary | ICD-10-CM | POA: Diagnosis not present

## 2020-11-25 DIAGNOSIS — Z96659 Presence of unspecified artificial knee joint: Secondary | ICD-10-CM | POA: Diagnosis not present

## 2020-11-25 NOTE — Telephone Encounter (Signed)
Attempted to contact patient's grandson Eric Pratt to schedule a Palliative Care consult appointment. No answer and unable to leave a message.   Called home number and that number is invalid. Left a message on wife Eric Pratt's cell number.

## 2020-11-26 ENCOUNTER — Telehealth: Payer: Self-pay

## 2020-11-26 NOTE — Telephone Encounter (Signed)
Spoke with patient's wife Jana Half and scheduled an in-person Palliative Consult for 12/09/20 @ 1PM  COVID screening was negative. No pets in home. Patient lives with wife.  Consent obtained; updated Outlook/Netsmart/Team List and Epic.  Family is aware they will be receiving a call from NP the day before or day of to confirm appointment.

## 2020-11-27 DIAGNOSIS — J449 Chronic obstructive pulmonary disease, unspecified: Secondary | ICD-10-CM | POA: Diagnosis not present

## 2020-11-27 DIAGNOSIS — Z7982 Long term (current) use of aspirin: Secondary | ICD-10-CM | POA: Diagnosis not present

## 2020-11-27 DIAGNOSIS — I10 Essential (primary) hypertension: Secondary | ICD-10-CM | POA: Diagnosis not present

## 2020-11-27 DIAGNOSIS — Z993 Dependence on wheelchair: Secondary | ICD-10-CM | POA: Diagnosis not present

## 2020-11-27 DIAGNOSIS — Z87891 Personal history of nicotine dependence: Secondary | ICD-10-CM | POA: Diagnosis not present

## 2020-11-27 DIAGNOSIS — Z9981 Dependence on supplemental oxygen: Secondary | ICD-10-CM | POA: Diagnosis not present

## 2020-11-27 DIAGNOSIS — M6281 Muscle weakness (generalized): Secondary | ICD-10-CM | POA: Diagnosis not present

## 2020-11-27 DIAGNOSIS — Z7952 Long term (current) use of systemic steroids: Secondary | ICD-10-CM | POA: Diagnosis not present

## 2020-11-27 DIAGNOSIS — Z96659 Presence of unspecified artificial knee joint: Secondary | ICD-10-CM | POA: Diagnosis not present

## 2020-11-28 DIAGNOSIS — M6281 Muscle weakness (generalized): Secondary | ICD-10-CM | POA: Diagnosis not present

## 2020-11-28 DIAGNOSIS — Z96659 Presence of unspecified artificial knee joint: Secondary | ICD-10-CM | POA: Diagnosis not present

## 2020-11-28 DIAGNOSIS — Z9981 Dependence on supplemental oxygen: Secondary | ICD-10-CM | POA: Diagnosis not present

## 2020-11-28 DIAGNOSIS — Z993 Dependence on wheelchair: Secondary | ICD-10-CM | POA: Diagnosis not present

## 2020-11-28 DIAGNOSIS — Z87891 Personal history of nicotine dependence: Secondary | ICD-10-CM | POA: Diagnosis not present

## 2020-11-28 DIAGNOSIS — J449 Chronic obstructive pulmonary disease, unspecified: Secondary | ICD-10-CM | POA: Diagnosis not present

## 2020-11-28 DIAGNOSIS — Z7952 Long term (current) use of systemic steroids: Secondary | ICD-10-CM | POA: Diagnosis not present

## 2020-11-28 DIAGNOSIS — Z7982 Long term (current) use of aspirin: Secondary | ICD-10-CM | POA: Diagnosis not present

## 2020-11-28 DIAGNOSIS — I10 Essential (primary) hypertension: Secondary | ICD-10-CM | POA: Diagnosis not present

## 2020-12-05 DIAGNOSIS — J449 Chronic obstructive pulmonary disease, unspecified: Secondary | ICD-10-CM | POA: Diagnosis not present

## 2020-12-05 DIAGNOSIS — Z993 Dependence on wheelchair: Secondary | ICD-10-CM | POA: Diagnosis not present

## 2020-12-05 DIAGNOSIS — I10 Essential (primary) hypertension: Secondary | ICD-10-CM | POA: Diagnosis not present

## 2020-12-05 DIAGNOSIS — Z87891 Personal history of nicotine dependence: Secondary | ICD-10-CM | POA: Diagnosis not present

## 2020-12-05 DIAGNOSIS — Z7952 Long term (current) use of systemic steroids: Secondary | ICD-10-CM | POA: Diagnosis not present

## 2020-12-05 DIAGNOSIS — M6281 Muscle weakness (generalized): Secondary | ICD-10-CM | POA: Diagnosis not present

## 2020-12-05 DIAGNOSIS — Z7982 Long term (current) use of aspirin: Secondary | ICD-10-CM | POA: Diagnosis not present

## 2020-12-05 DIAGNOSIS — Z96659 Presence of unspecified artificial knee joint: Secondary | ICD-10-CM | POA: Diagnosis not present

## 2020-12-05 DIAGNOSIS — Z9981 Dependence on supplemental oxygen: Secondary | ICD-10-CM | POA: Diagnosis not present

## 2020-12-08 DIAGNOSIS — Z7982 Long term (current) use of aspirin: Secondary | ICD-10-CM | POA: Diagnosis not present

## 2020-12-08 DIAGNOSIS — M6281 Muscle weakness (generalized): Secondary | ICD-10-CM | POA: Diagnosis not present

## 2020-12-08 DIAGNOSIS — Z87891 Personal history of nicotine dependence: Secondary | ICD-10-CM | POA: Diagnosis not present

## 2020-12-08 DIAGNOSIS — Z7952 Long term (current) use of systemic steroids: Secondary | ICD-10-CM | POA: Diagnosis not present

## 2020-12-08 DIAGNOSIS — Z993 Dependence on wheelchair: Secondary | ICD-10-CM | POA: Diagnosis not present

## 2020-12-08 DIAGNOSIS — Z9981 Dependence on supplemental oxygen: Secondary | ICD-10-CM | POA: Diagnosis not present

## 2020-12-08 DIAGNOSIS — Z96659 Presence of unspecified artificial knee joint: Secondary | ICD-10-CM | POA: Diagnosis not present

## 2020-12-08 DIAGNOSIS — I10 Essential (primary) hypertension: Secondary | ICD-10-CM | POA: Diagnosis not present

## 2020-12-08 DIAGNOSIS — J449 Chronic obstructive pulmonary disease, unspecified: Secondary | ICD-10-CM | POA: Diagnosis not present

## 2020-12-08 DIAGNOSIS — R059 Cough, unspecified: Secondary | ICD-10-CM | POA: Diagnosis not present

## 2020-12-09 ENCOUNTER — Other Ambulatory Visit: Payer: Self-pay

## 2020-12-09 ENCOUNTER — Other Ambulatory Visit: Payer: Medicare Other | Admitting: Nurse Practitioner

## 2020-12-09 DIAGNOSIS — F0390 Unspecified dementia without behavioral disturbance: Secondary | ICD-10-CM

## 2020-12-09 DIAGNOSIS — Z515 Encounter for palliative care: Secondary | ICD-10-CM | POA: Diagnosis not present

## 2020-12-09 NOTE — Progress Notes (Signed)
[facility] Consult Note Telephone: [phone]  Fax: [phone]  PATIENT NAME: Eric Pratt [address] [place] [address] [phone] (home)  DOB: [birth year] MRN: [id number]  PRIMARY CARE PROVIDER:    Glenda Chroman, MD,  [place] [address] [phone]  REFERRING PROVIDER:   Glenda Chroman, MD [address] [place],  [place] [address] [phone]  RESPONSIBLE PARTY:   Extended Emergency Contact Information Primary Emergency Contact: Eric Pratt Address: [address]          [place], [place] [address] Eric Pratt of [place] Phone: [phone] Mobile Phone: [phone] Relation: Spouse  Due to the COVID-19, this visit was done via telephone from my office and it was initiated and consent by this patient and/or family.  Present during visit is patient's wife and caregiver Eric Pratt.  ASSESSMENT AND RECOMMENDATIONS:   Advance Care Planning: Goal of care: Goal of care is comfort. Directives: Family report patient has a signed DNR form in home.   Symptom Management:  Constipation: Last bowel movement was 3 days ago, stool reported as small. Patient currently receiving Miralax 17g by mouth daily. No report of nausea, vomiting, abdominal pain or abdominal distention. Recommendation: Increase Miralax frequency to twice day until patient has diarrhea then stop, and resume daily. COVID -19 infection: Patient, wife and his grand son all tested +ve to COVID 19 infection yesterday. Family report patient is currently asymptomatic.  Recommendation: Continue to monitor for fever, or evidence of respiratory decline such as acute cough or worsening SOB. Notify his PCP promptly.  Follow up Palliative Care Visit: Palliative care will continue to follow for complex decision making and symptom management. Return 3 weeks or prn.  Family /Caregiver/Community Supports: Patient lives at home with his wife. Has paid personal  care givers.  Cognitive / Functional decline:  Family report patient requires total assist with his ADLs. One person assist with transfers, non ambulatory.  I spent 25 minutes providing this consultation. More than 50% of the time in this consultation was spent counseling and coordinating communication.   CHIEF COMPLAINT: Constipation  History obtained from review of EMR and discussion with patient's wife and caregiver. Records reviewed and summarized bellow.  HISTORY OF PRESENT ILLNESS:  Eric Pratt is a 86 y.o. year old male with multiple medical problems including Alzheimer's dementia (FAST 7b), COPD on continuous oxygen at 6L, Hx of CVA.  Patient tested positive for COVID-19 infection yesterday, family report patient is asymptomatic. Palliative Care was asked to follow this patient by consultation request of Eric Bears B, MD to help address advance care planning and goals of care. This is an initial visit.  CODE STATUS: DNR  PPS: 30%  HOSPICE ELIGIBILITY/DIAGNOSIS: TBD  ROS Limited ROS due to patient dementia, pertinent ROS noted in HPI.   Physical Exam:  Unable to complete due to tele-health visit   PAST MEDICAL HISTORY:  Past Medical History:  Diagnosis Date  . Allergic rhinitis   . Arthritis    hands  . Colon polyps   . GERD (gastroesophageal reflux disease)   . H. pylori infection 2001  . Hemorrhoids   . Hypercholesterolemia   . Hypertension   . Inguinal hernia   . Ischemia    small vessel/lacunar infarction  . Pneumonia   . Skin cancer    squamous cell    SOCIAL HX:  Social History   Tobacco Use  . Smoking status: Former Research scientist (life sciences)  . Smokeless  tobacco: Former Systems developer    Types: Chew  . Tobacco comment: quit 1985  Substance Use Topics  . Alcohol use: Yes    Alcohol/week: 1.0 standard drink    Types: 1 Standard drinks or equivalent per week   FAMILY HX:  Family History  Problem Relation Age of Onset  . Colon cancer Brother   . Prostate cancer  Brother   . Melanoma Mother   . Breast cancer Mother   . Heart failure Mother   . Heart failure Brother     ALLERGIES:  Allergies  Allergen Reactions  . Bactrim Ds [Sulfamethoxazole-Trimethoprim] Rash     PERTINENT MEDICATIONS:  Outpatient Encounter Medications as of 12/09/2020  Medication Sig  . aspirin EC 81 MG tablet Take 81 mg by mouth daily.  . diazepam (VALIUM) 5 MG tablet Take one tablet 30 minutes prior to MRI  . NAMZARIC 28-10 MG CP24 TAKE 1 CAPSULE BY MOUTH DAILY  . omeprazole (PRILOSEC) 20 MG capsule Take 20 mg by mouth daily.  . SYMBICORT 160-4.5 MCG/ACT inhaler   . traZODone (DESYREL) 50 MG tablet Take 0.5 tablets (25 mg total) by mouth at bedtime.   No facility-administered encounter medications on file as of 12/09/2020.    Thank you for the opportunity to participate in the care of Eric Pratt. The palliative care team will continue to follow. Please call our office at [phone] if we can be of additional assistance.  Eric Favre, DNP, AGPCNP-BC

## 2020-12-15 DIAGNOSIS — G309 Alzheimer's disease, unspecified: Secondary | ICD-10-CM | POA: Diagnosis not present

## 2020-12-15 DIAGNOSIS — M1990 Unspecified osteoarthritis, unspecified site: Secondary | ICD-10-CM | POA: Diagnosis not present

## 2020-12-16 DIAGNOSIS — I10 Essential (primary) hypertension: Secondary | ICD-10-CM | POA: Diagnosis not present

## 2020-12-16 DIAGNOSIS — Z96659 Presence of unspecified artificial knee joint: Secondary | ICD-10-CM | POA: Diagnosis not present

## 2020-12-16 DIAGNOSIS — Z9981 Dependence on supplemental oxygen: Secondary | ICD-10-CM | POA: Diagnosis not present

## 2020-12-16 DIAGNOSIS — M6281 Muscle weakness (generalized): Secondary | ICD-10-CM | POA: Diagnosis not present

## 2020-12-16 DIAGNOSIS — J449 Chronic obstructive pulmonary disease, unspecified: Secondary | ICD-10-CM | POA: Diagnosis not present

## 2020-12-16 DIAGNOSIS — Z87891 Personal history of nicotine dependence: Secondary | ICD-10-CM | POA: Diagnosis not present

## 2020-12-16 DIAGNOSIS — Z7982 Long term (current) use of aspirin: Secondary | ICD-10-CM | POA: Diagnosis not present

## 2020-12-16 DIAGNOSIS — Z7952 Long term (current) use of systemic steroids: Secondary | ICD-10-CM | POA: Diagnosis not present

## 2020-12-16 DIAGNOSIS — Z993 Dependence on wheelchair: Secondary | ICD-10-CM | POA: Diagnosis not present

## 2020-12-17 DIAGNOSIS — J8410 Pulmonary fibrosis, unspecified: Secondary | ICD-10-CM | POA: Diagnosis not present

## 2020-12-20 DIAGNOSIS — M6281 Muscle weakness (generalized): Secondary | ICD-10-CM | POA: Diagnosis not present

## 2020-12-20 DIAGNOSIS — J9601 Acute respiratory failure with hypoxia: Secondary | ICD-10-CM | POA: Diagnosis not present

## 2020-12-20 DIAGNOSIS — J8410 Pulmonary fibrosis, unspecified: Secondary | ICD-10-CM | POA: Diagnosis not present

## 2020-12-24 DIAGNOSIS — N1831 Chronic kidney disease, stage 3a: Secondary | ICD-10-CM | POA: Diagnosis not present

## 2020-12-24 DIAGNOSIS — G309 Alzheimer's disease, unspecified: Secondary | ICD-10-CM | POA: Diagnosis not present

## 2020-12-24 DIAGNOSIS — J449 Chronic obstructive pulmonary disease, unspecified: Secondary | ICD-10-CM | POA: Diagnosis not present

## 2020-12-25 DIAGNOSIS — I10 Essential (primary) hypertension: Secondary | ICD-10-CM | POA: Diagnosis not present

## 2020-12-25 DIAGNOSIS — M6281 Muscle weakness (generalized): Secondary | ICD-10-CM | POA: Diagnosis not present

## 2020-12-25 DIAGNOSIS — J449 Chronic obstructive pulmonary disease, unspecified: Secondary | ICD-10-CM | POA: Diagnosis not present

## 2020-12-25 DIAGNOSIS — Z7952 Long term (current) use of systemic steroids: Secondary | ICD-10-CM | POA: Diagnosis not present

## 2020-12-25 DIAGNOSIS — Z96659 Presence of unspecified artificial knee joint: Secondary | ICD-10-CM | POA: Diagnosis not present

## 2020-12-25 DIAGNOSIS — Z87891 Personal history of nicotine dependence: Secondary | ICD-10-CM | POA: Diagnosis not present

## 2020-12-25 DIAGNOSIS — Z9981 Dependence on supplemental oxygen: Secondary | ICD-10-CM | POA: Diagnosis not present

## 2020-12-25 DIAGNOSIS — Z7982 Long term (current) use of aspirin: Secondary | ICD-10-CM | POA: Diagnosis not present

## 2020-12-25 DIAGNOSIS — Z993 Dependence on wheelchair: Secondary | ICD-10-CM | POA: Diagnosis not present

## 2020-12-30 DIAGNOSIS — L409 Psoriasis, unspecified: Secondary | ICD-10-CM | POA: Diagnosis not present

## 2020-12-30 DIAGNOSIS — Z23 Encounter for immunization: Secondary | ICD-10-CM | POA: Diagnosis not present

## 2020-12-30 DIAGNOSIS — J449 Chronic obstructive pulmonary disease, unspecified: Secondary | ICD-10-CM | POA: Diagnosis not present

## 2020-12-30 DIAGNOSIS — G309 Alzheimer's disease, unspecified: Secondary | ICD-10-CM | POA: Diagnosis not present

## 2020-12-30 DIAGNOSIS — J9610 Chronic respiratory failure, unspecified whether with hypoxia or hypercapnia: Secondary | ICD-10-CM | POA: Diagnosis not present

## 2020-12-30 DIAGNOSIS — Z0001 Encounter for general adult medical examination with abnormal findings: Secondary | ICD-10-CM | POA: Diagnosis not present

## 2021-01-02 DIAGNOSIS — Z993 Dependence on wheelchair: Secondary | ICD-10-CM | POA: Diagnosis not present

## 2021-01-02 DIAGNOSIS — Z9981 Dependence on supplemental oxygen: Secondary | ICD-10-CM | POA: Diagnosis not present

## 2021-01-02 DIAGNOSIS — I10 Essential (primary) hypertension: Secondary | ICD-10-CM | POA: Diagnosis not present

## 2021-01-02 DIAGNOSIS — Z7952 Long term (current) use of systemic steroids: Secondary | ICD-10-CM | POA: Diagnosis not present

## 2021-01-02 DIAGNOSIS — Z7982 Long term (current) use of aspirin: Secondary | ICD-10-CM | POA: Diagnosis not present

## 2021-01-02 DIAGNOSIS — Z96659 Presence of unspecified artificial knee joint: Secondary | ICD-10-CM | POA: Diagnosis not present

## 2021-01-02 DIAGNOSIS — Z87891 Personal history of nicotine dependence: Secondary | ICD-10-CM | POA: Diagnosis not present

## 2021-01-02 DIAGNOSIS — J449 Chronic obstructive pulmonary disease, unspecified: Secondary | ICD-10-CM | POA: Diagnosis not present

## 2021-01-02 DIAGNOSIS — M6281 Muscle weakness (generalized): Secondary | ICD-10-CM | POA: Diagnosis not present

## 2021-01-05 DIAGNOSIS — I10 Essential (primary) hypertension: Secondary | ICD-10-CM | POA: Diagnosis not present

## 2021-01-05 DIAGNOSIS — Z7982 Long term (current) use of aspirin: Secondary | ICD-10-CM | POA: Diagnosis not present

## 2021-01-05 DIAGNOSIS — Z9981 Dependence on supplemental oxygen: Secondary | ICD-10-CM | POA: Diagnosis not present

## 2021-01-05 DIAGNOSIS — Z96659 Presence of unspecified artificial knee joint: Secondary | ICD-10-CM | POA: Diagnosis not present

## 2021-01-05 DIAGNOSIS — Z993 Dependence on wheelchair: Secondary | ICD-10-CM | POA: Diagnosis not present

## 2021-01-05 DIAGNOSIS — J449 Chronic obstructive pulmonary disease, unspecified: Secondary | ICD-10-CM | POA: Diagnosis not present

## 2021-01-05 DIAGNOSIS — Z87891 Personal history of nicotine dependence: Secondary | ICD-10-CM | POA: Diagnosis not present

## 2021-01-05 DIAGNOSIS — Z7952 Long term (current) use of systemic steroids: Secondary | ICD-10-CM | POA: Diagnosis not present

## 2021-01-05 DIAGNOSIS — M6281 Muscle weakness (generalized): Secondary | ICD-10-CM | POA: Diagnosis not present

## 2021-01-09 ENCOUNTER — Other Ambulatory Visit: Payer: Self-pay

## 2021-01-09 ENCOUNTER — Other Ambulatory Visit: Payer: Medicare Other | Admitting: Nurse Practitioner

## 2021-01-09 DIAGNOSIS — Z515 Encounter for palliative care: Secondary | ICD-10-CM | POA: Diagnosis not present

## 2021-01-09 DIAGNOSIS — G309 Alzheimer's disease, unspecified: Secondary | ICD-10-CM | POA: Diagnosis not present

## 2021-01-09 DIAGNOSIS — F0280 Dementia in other diseases classified elsewhere without behavioral disturbance: Secondary | ICD-10-CM

## 2021-01-09 NOTE — Progress Notes (Signed)
[facility] Consult Note Telephone: [phone]  Fax: [phone]  PATIENT NAME: Eric Pratt [address] [place] [address] [phone] (home)  DOB: 04-28-1935 MRN: [id number]  PRIMARY CARE PROVIDER:    Glenda Chroman, MD,  [place] [address] [phone]  REFERRING PROVIDER:   Glenda Chroman, MD [address] [place],  [place] [address] [phone]  RESPONSIBLE PARTY:   Extended Emergency Contact Information Primary Emergency Contact: [facility] Address: [address]          [place], [place] [address] Johnnette Litter of [place] Phone: [phone] Mobile Phone: [phone] Relation: Spouse  I met face to face with patient and family in home. Caregiver Alex present at visit.  Chief complain: follow up palliative care visit  Advance Care Planning: ACP discussion held with patient's wife. Patient unable to substantiatively participate in discussion due to poor cognition related to advance dementia. Goal of care: Patient's goal of care is comfort Directives: Family report patient's code status as DNR, saying patient does not want to be resuscitated in the event of cardiac or respiratory arrest. Patient however does not have a signed DNR form in home. DNR form signed for patient today. The need to complete a MOST form was discussed, family expressed interest in completing a MOST form today. Discussed and reviewed sections of the form in detail, opportunity for questions given, all questions answered. Form completed and signed with patient's wife, signed form given to wife to keep. Details of MOST form include comfort measures, determine use and limitation of antibiotics, IV fluids for a defined trial period, no feeding tube. Palliative care will continue to provided support to patient, family and medical team.  I spent 48 minutes providing this consultation. More than 50% of the time in this consultation was spent in  counseling and care coordination.  ---------------------------------------------------------------------------------   ASSESSMENT AND RECOMMENDATIONS:   Symptom Management:  Dementia: Condition is progressive without report of behavioral concern. Family report patient take Xanax 0.84m at bedtime with good results, report patient sleeps through the night. Constipation: family report resolution of problem. Report patient having regular bowel movements, without report of diarrhea.Continue administration of Miralax 17g as needed if no BM in >2 days. COPD: patient tested positive to COVID-19 virus last month, he was asymptomatic. Everyone in his household recovered from the infection. He continues on 6L oxygen via nasal cannula. Unable to obtain pulse oximetry from him today due to cool fingers. No report of acute cough or increased SOB. No evidence of acute respiratory distress noted. Questions and concerns were addressed. Family was encouraged to call with questions and/or concerns. Provided general support and encouragement, no other unmet needs identified at this time.   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 Has paid personal care givers.   Cognitive / Functional decline: Patient awake and alert, confused. Speaks in few words, unable to carry-on conversation. He is total assist with his ADLs, unable to transfer unassisted, non ambulatory. No report of falls  Follow up Palliative Care Visit: Palliative care will continue to follow for complex decision making and symptom management. Return in about 5 weeks or prn.  History obtained from review of EMR and discussion with family and caregiver. Records reviewed and summarized bellow.  HISTORY OF PRESENT ILLNESS:  Eric IORIOis a 86y.o. year old male with multiple medical problems including Alzheimer's dementia (FAST 7b), COPD on continuous  oxygen at 6L, Hx of CVA.  Patient with  progressive dementia without report of behavior disturbance. No report of fever, chills, SOB, uncontrolled pain, or insomnia. Family report good appetite. Palliative Care was asked to help address advance care planning and symptoms management.  CODE STATUS: DNR  PPS: 30%  HOSPICE ELIGIBILITY/DIAGNOSIS: TBD  ROS Limited ROS due to poor cognition, pertinent ROS noted in HPI.   Physical Exam: Current and past weights: no recent weight reported. Family report weight is usually between 170-175lbs. General: frail appearing, cooperative, sitting in wheelchair in NAD CV: no LE edema Pulmonary: no increased work of breathing, no cough, no audible wheezes Abdomen: no ascites MSK:  no contractures, non ambulatory Skin: warm and dry, no rashes or wounds on visible skin Neuro: Generalized weakness, severe cognitive impairment Psych: non-anxious affect today Hem/lymph/immuno: no widespread bruising   PAST MEDICAL HISTORY:  Past Medical History:  Diagnosis Date  . Allergic rhinitis   . Arthritis    hands  . Colon polyps   . GERD (gastroesophageal reflux disease)   . H. pylori infection 2001  . Hemorrhoids   . Hypercholesterolemia   . Hypertension   . Inguinal hernia   . Ischemia    small vessel/lacunar infarction  . Pneumonia   . Skin cancer    squamous cell    SOCIAL HX:  Social History   Tobacco Use  . Smoking status: Former Research scientist (life sciences)  . Smokeless tobacco: Former Systems developer    Types: Chew  . Tobacco comment: quit 1985  Substance Use Topics  . Alcohol use: Yes    Alcohol/week: 1.0 standard drink    Types: 1 Standard drinks or equivalent per week   FAMILY HX:  Family History  Problem Relation Age of Onset  . Colon cancer Brother   . Prostate cancer Brother   . Melanoma Mother   . Breast cancer Mother   . Heart failure Mother   . Heart failure Brother     ALLERGIES:  Allergies  Allergen Reactions  . Bactrim Ds [Sulfamethoxazole-Trimethoprim] Rash     PERTINENT  MEDICATIONS:  Outpatient Encounter Medications as of 01/09/2021  Medication Sig  . aspirin EC 81 MG tablet Take 81 mg by mouth daily.  . diazepam (VALIUM) 5 MG tablet Take one tablet 30 minutes prior to MRI  . NAMZARIC 28-10 MG CP24 TAKE 1 CAPSULE BY MOUTH DAILY  . omeprazole (PRILOSEC) 20 MG capsule Take 20 mg by mouth daily.  . SYMBICORT 160-4.5 MCG/ACT inhaler   . traZODone (DESYREL) 50 MG tablet Take 0.5 tablets (25 mg total) by mouth at bedtime.   No facility-administered encounter medications on file as of 01/09/2021.    Thank you for the opportunity to participate in the care of Mr. Eric Pratt. The palliative care team will continue to follow. Please call our office at [phone] if we can be of additional assistance.  Jari Favre, DNP, AGPCNP-BC

## 2021-01-15 DIAGNOSIS — M1990 Unspecified osteoarthritis, unspecified site: Secondary | ICD-10-CM | POA: Diagnosis not present

## 2021-01-15 DIAGNOSIS — G309 Alzheimer's disease, unspecified: Secondary | ICD-10-CM | POA: Diagnosis not present

## 2021-01-17 DIAGNOSIS — J8410 Pulmonary fibrosis, unspecified: Secondary | ICD-10-CM | POA: Diagnosis not present

## 2021-01-20 DIAGNOSIS — J8410 Pulmonary fibrosis, unspecified: Secondary | ICD-10-CM | POA: Diagnosis not present

## 2021-01-20 DIAGNOSIS — M6281 Muscle weakness (generalized): Secondary | ICD-10-CM | POA: Diagnosis not present

## 2021-01-20 DIAGNOSIS — J9601 Acute respiratory failure with hypoxia: Secondary | ICD-10-CM | POA: Diagnosis not present

## 2021-02-06 DIAGNOSIS — J9611 Chronic respiratory failure with hypoxia: Secondary | ICD-10-CM | POA: Diagnosis not present

## 2021-02-06 DIAGNOSIS — J441 Chronic obstructive pulmonary disease with (acute) exacerbation: Secondary | ICD-10-CM | POA: Diagnosis not present

## 2021-02-14 DIAGNOSIS — M1990 Unspecified osteoarthritis, unspecified site: Secondary | ICD-10-CM | POA: Diagnosis not present

## 2021-02-14 DIAGNOSIS — G309 Alzheimer's disease, unspecified: Secondary | ICD-10-CM | POA: Diagnosis not present

## 2021-02-16 DIAGNOSIS — J8410 Pulmonary fibrosis, unspecified: Secondary | ICD-10-CM | POA: Diagnosis not present

## 2021-02-19 DIAGNOSIS — J8410 Pulmonary fibrosis, unspecified: Secondary | ICD-10-CM | POA: Diagnosis not present

## 2021-02-19 DIAGNOSIS — M6281 Muscle weakness (generalized): Secondary | ICD-10-CM | POA: Diagnosis not present

## 2021-02-19 DIAGNOSIS — J9601 Acute respiratory failure with hypoxia: Secondary | ICD-10-CM | POA: Diagnosis not present

## 2021-02-20 ENCOUNTER — Other Ambulatory Visit: Payer: Self-pay

## 2021-02-20 ENCOUNTER — Other Ambulatory Visit: Payer: Medicare Other | Admitting: Nurse Practitioner

## 2021-02-20 DIAGNOSIS — Z515 Encounter for palliative care: Secondary | ICD-10-CM | POA: Diagnosis not present

## 2021-02-20 DIAGNOSIS — G309 Alzheimer's disease, unspecified: Secondary | ICD-10-CM

## 2021-02-20 DIAGNOSIS — F0280 Dementia in other diseases classified elsewhere without behavioral disturbance: Secondary | ICD-10-CM

## 2021-02-20 DIAGNOSIS — J9611 Chronic respiratory failure with hypoxia: Secondary | ICD-10-CM | POA: Diagnosis not present

## 2021-02-20 NOTE — Progress Notes (Signed)
[facility] Palliative Care Consult Note Telephone: [phone]  Fax: [phone]    Date of encounter: 02/20/21 PATIENT NAME: Eric Pratt [address] [place] [address]   [phone] (home)  DOB: [birth year] MRN: [id number]  PRIMARY CARE PROVIDER:    Ignatius Specking, MD,  [address] [place] [address] [phone]  REFERRING PROVIDER:   Ignatius Specking, MD [address] [place],  [place] [address] [phone]  RESPONSIBLE PARTY:    Contact Information    Name Relation Home Work Mobile   [place] Spouse [phone]  [phone]     I met face to face with patient in home. His spouse and his caregiver were present during visit.  Palliative Care was asked to follow this patient by consultation request of  Ignatius Specking, MD to address advance care planning and complex medical decision making. This is a follow up visit.                                  ASSESSMENT AND PLAN / RECOMMENDATIONS:   Advance Care Planning/Goals of Care:  CODE STATUS: DNR Goal of care: Goal of care is comfort. Directive: Signed DNR and MOST form present in home. Details of MOST form include comfort measures, determine use and limitation of antibiotics, IV fluids for a defined trial period, no feeding tube.  Symptom Management/Plan: Chronic respiratory failure with hypoxia: Unable to obtain oxygen saturation due to cool fingers. No evidence of acute distress on physical exam.  Continue Symbicort inhaler. Continue supportive care, continue cough deep breathing exercises. Diarrhea: Improved since stopping antibiotics. Continue to hold Miralax if patient is having bowel movements daily. Give Miralax if no BM in more than 2 days.   Dementia: Stable without report of behavior concerns. Continue supportive care.   Follow up Palliative Care Visit: Palliative care will continue to follow for complex medical decision making, advance care planning, and clarification of  goals. Return in about 4 weeks or prn.  PPS: 40% weak  HOSPICE ELIGIBILITY/DIAGNOSIS: TBD  CHIEF COMPLAIN: Palliative care follow up on COPD exacerbation  History obtained from review of Epic EMR and discussion with his wife and caregiver.  HISTORY OF PRESENT ILLNESS:Eric P Shoemakeris a 86 y.o.year old malewith multiple medical problems including Alzheimer's dementia (FAST 7b), COPD on continuous oxygen at 6L, Hx of CVA.  Patient was treated for COPD exacerbation with Steroid taper and Augmentin. Family report patient tolerated antibiotics well. Respiratory symptoms has since resolved. Family report patient with loose stools that has improved since stopping Augmentin. No report of nausea or vomiting, fever, chills, increased SOB, or change in appetite. Ten systems reviewed and are negative for acute change, except as noted in the HPI.   I reviewed available labs, medications, imaging, studies and related documents from the EMR.  Records reviewed and summarized above.   Physical Exam: Vital sign: BP 118/80, P 70, RR 18 General: frail appearing, cooperative, sitting in wheelchair finishing lunch CV:no LE edema Pulmonary: no increased work of breathing, no cough, no audible wheezes Abdomen:no ascites MSK: no contractures, non ambulatory Skin: warm and dry, no rashes or wounds on visible skin Neuro: Generalized weakness,severecognitive impairment Psych: non-anxious affecttoday Hem/lymph/immuno: no widespread bruising  Past Medical History:  Diagnosis Date  . Allergic rhinitis   . Arthritis    hands  . Colon polyps   . GERD (gastroesophageal reflux disease)   .  H. pylori infection 2001  . Hemorrhoids   . Hypercholesterolemia   . Hypertension   . Inguinal hernia   . Ischemia    small vessel/lacunar infarction  . Pneumonia   . Skin cancer    squamous cell   Current Outpatient Medications on File Prior to Visit  Medication Sig Dispense Refill  . aspirin EC 81 MG  tablet Take 81 mg by mouth daily.    . diazepam (VALIUM) 5 MG tablet Take one tablet 30 minutes prior to MRI 1 tablet 0  . NAMZARIC 28-10 MG CP24 TAKE 1 CAPSULE BY MOUTH DAILY 30 capsule 5  . omeprazole (PRILOSEC) 20 MG capsule Take 20 mg by mouth daily.    . SYMBICORT 160-4.5 MCG/ACT inhaler     . traZODone (DESYREL) 50 MG tablet Take 0.5 tablets (25 mg total) by mouth at bedtime. 15 tablet 5   No current facility-administered medications on file prior to visit.   Thank you for the opportunity to participate in the care of Mr. Heiman.  The palliative care team will continue to follow. Please call our office at [phone] if we can be of additional assistance.   Alba Destine, NP DNP,AGPCNP-BC  COVID-19 PATIENT SCREENING TOOL Asked and negative response unless otherwise noted:   Have you had symptoms of covid, tested positive or been in contact with someone with symptoms/positive test in the past 5-10 days?

## 2021-03-17 DIAGNOSIS — M1990 Unspecified osteoarthritis, unspecified site: Secondary | ICD-10-CM | POA: Diagnosis not present

## 2021-03-17 DIAGNOSIS — G309 Alzheimer's disease, unspecified: Secondary | ICD-10-CM | POA: Diagnosis not present

## 2021-03-19 DIAGNOSIS — J8410 Pulmonary fibrosis, unspecified: Secondary | ICD-10-CM | POA: Diagnosis not present

## 2021-03-23 DIAGNOSIS — J441 Chronic obstructive pulmonary disease with (acute) exacerbation: Secondary | ICD-10-CM | POA: Diagnosis not present

## 2021-04-15 ENCOUNTER — Other Ambulatory Visit: Payer: Self-pay

## 2021-04-15 ENCOUNTER — Other Ambulatory Visit: Payer: Medicare Other | Admitting: Nurse Practitioner

## 2021-04-15 VITALS — BP 130/70 | HR 95 | Resp 18

## 2021-04-15 DIAGNOSIS — J9611 Chronic respiratory failure with hypoxia: Secondary | ICD-10-CM | POA: Diagnosis not present

## 2021-04-15 DIAGNOSIS — F0280 Dementia in other diseases classified elsewhere without behavioral disturbance: Secondary | ICD-10-CM

## 2021-04-15 DIAGNOSIS — Z515 Encounter for palliative care: Secondary | ICD-10-CM

## 2021-04-15 DIAGNOSIS — G309 Alzheimer's disease, unspecified: Secondary | ICD-10-CM | POA: Diagnosis not present

## 2021-04-15 NOTE — Progress Notes (Signed)
[facility] Palliative Care Consult Note Telephone: [phone]  Fax: [phone]    Date of encounter: 04/15/21 PATIENT NAME: Eric Pratt [address] [place] [address]   [phone] (home)  DOB: 1935/05/18 MRN: [id number]  PRIMARY CARE PROVIDER:    Glenda Chroman, MD,  [place] [address] [phone]  REFERRING PROVIDER:   Glenda Chroman, MD [address] [place],  [place] [address] [phone]  RESPONSIBLE PARTY:    Contact Information     Name Relation Home Work [place] Spouse [phone]  [phone]     I met face to face with patient in home. His wife and his care giver Cristie Hem present during visit. Palliative Care was asked to follow this patient by consultation request of  Glenda Chroman, MD to address advance care planning and complex medical decision making. This is a follow up visit.                                 ASSESSMENT AND PLAN / RECOMMENDATIONS:   Advance Care Planning/Goals of Care: Goals include to maximize quality of life and symptom management.   CODE STATUS: DNR Goal of care: Patient's goal of care is comfort. Directive: Signed DNR and MOST form present in home. Details of MOST form include comfort measures, determine use and limitation of antibiotics, IV fluids for a defined trial period, no feeding tube.  Symptom Management/Plan: COPD: Patient was treated for COPD exacerbation twice in the last 90 days. No evidence of respiratory distress or infection noted during visit today. Oxygen saturation 96% on 6L. Continue current plan of care with Symbicort. Alzheimer's Dementia: No report of behavior concerns. Family however report patient sleeping more. Continue supportive care. Continue Namzaric 28-10mg  daily. No report of uncontrolled pain Discussion on disease trajectory of dementia with family, as it is progressive and terminal and likely to eventually lead to dysphagia, weight loss and  worsening immobility. Emotional and supportive care provided. Provided general support and encouragement. Questions and concerns were addressed. Patient and family was encouraged to call with questions and/or concerns.  Follow up Palliative Care Visit: Palliative care will continue to follow for complex medical decision making, advance care planning, and clarification of goals. Return in about 6-8 weeks or prn.  PPS: 40%  HOSPICE ELIGIBILITY/DIAGNOSIS: TBD  CHIEF COMPLAIN: Palliative care visit for follow up on COPD  History obtained from review of Epic EMR and discussion with patient's wife and his caregiver.  HISTORY OF PRESENT ILLNESS:  Eric Pratt is a 86 y.o. year old male with medical history significant for advance dementia and COPD with dependence on supplemental oxygen at 6L.  COPD is ongoing and currently stable on current regimen. Patient was treated for COPD exacerbation last month with Prednisone and 5 days course of Levaquin. Symptoms has since resolved. Patient continues to depend on family for all of his ADLs including feeding most times. No report of uncontrolled behavior or no report of dysphagia, report good appetite. No report of fever or chills.   I reviewed available labs, medications, imaging, studies and related documents from the EMR.  Records reviewed and summarized above.   Vitals:   04/15/21 1426  BP: 130/70  Pulse: 95  Resp: 18  SpO2: 96%    Physical Exam: General: frail appearing, cooperative, reclining in bed in NAD sleeping CV: S1S2 normal, no LE edema  Pulmonary: no increased work of breathing, no cough, no audible wheezes Abdomen: no ascites MSK:  no contractures, non ambulatory Skin: warm and dry, no rashes or wounds on visible skin Neuro: Generalized weakness, severe cognitive impairment Psych: non-anxious affect today Hem/lymph/immuno: no widespread bruising  Past Medical History:  Diagnosis Date   Allergic rhinitis    Arthritis     hands   Colon polyps    GERD (gastroesophageal reflux disease)    H. pylori infection 2001   Hemorrhoids    Hypercholesterolemia    Hypertension    Inguinal hernia    Ischemia    small vessel/lacunar infarction   Pneumonia    Skin cancer    squamous cell    Thank you for the opportunity to participate in the care of Mr. Falin.  The palliative care team will continue to follow. Please call our office at [phone] if we can be of additional assistance.   Jari Favre, DNP, AGPCNP-BC  COVID-19 PATIENT SCREENING TOOL Asked and negative response unless otherwise noted:   Have you had symptoms of covid, tested positive or been in contact with someone with symptoms/positive test in the past 5-10 days?

## 2021-04-16 DIAGNOSIS — M1990 Unspecified osteoarthritis, unspecified site: Secondary | ICD-10-CM | POA: Diagnosis not present

## 2021-04-16 DIAGNOSIS — G309 Alzheimer's disease, unspecified: Secondary | ICD-10-CM | POA: Diagnosis not present

## 2021-04-18 DIAGNOSIS — J8410 Pulmonary fibrosis, unspecified: Secondary | ICD-10-CM | POA: Diagnosis not present

## 2021-05-17 DIAGNOSIS — G309 Alzheimer's disease, unspecified: Secondary | ICD-10-CM | POA: Diagnosis not present

## 2021-05-17 DIAGNOSIS — M1990 Unspecified osteoarthritis, unspecified site: Secondary | ICD-10-CM | POA: Diagnosis not present

## 2021-05-19 DIAGNOSIS — J8410 Pulmonary fibrosis, unspecified: Secondary | ICD-10-CM | POA: Diagnosis not present

## 2021-05-19 DIAGNOSIS — R0902 Hypoxemia: Secondary | ICD-10-CM | POA: Diagnosis not present

## 2021-05-19 DIAGNOSIS — Z743 Need for continuous supervision: Secondary | ICD-10-CM | POA: Diagnosis not present

## 2021-05-19 DIAGNOSIS — R0602 Shortness of breath: Secondary | ICD-10-CM | POA: Diagnosis not present

## 2021-05-21 DIAGNOSIS — L03115 Cellulitis of right lower limb: Secondary | ICD-10-CM | POA: Diagnosis not present

## 2021-05-21 DIAGNOSIS — L03119 Cellulitis of unspecified part of limb: Secondary | ICD-10-CM | POA: Diagnosis not present

## 2021-06-01 ENCOUNTER — Other Ambulatory Visit: Payer: Medicare Other

## 2021-06-01 ENCOUNTER — Other Ambulatory Visit: Payer: Self-pay

## 2021-06-01 DIAGNOSIS — Z515 Encounter for palliative care: Secondary | ICD-10-CM

## 2021-06-01 NOTE — Progress Notes (Signed)
PATIENT NAME: Eric Pratt DOB: March 28, 1935 MRN: [id number]  PRIMARY CARE PROVIDER: Dr. Phylis Bougie  RESPONSIBLE PARTY:  Acct ID - Guarantor Home Phone Work Phone Relationship Acct Type  [id number] BRAELYNN, SCACCO* [phone]  Self P/F     [address], [place], [place] [address]    COMMUNITY PALLIATIVE CARE RN NOTE    PLAN OF CARE and INTERVENTION:  ADVANCE CARE PLANNING/GOALS OF CARE: to be comfortable and supported as disease progresses. DNR/MOST in place PATIENT/CAREGIVER EDUCATION: Education provided regarding Hospice services, maintaining patient safety. DISEASE STATUS:  RN Telephonic Palliative care encounter completed. Spoke with patient's long-term caregiver, Claris Pong. Alexandra expressed concern that patient continues to decline, and family requested that patient be evaluated for hospice services. Patient has been sleeping approximately 22 hours/day. He can stay awake for 30 minutes at a time and only if someone stays constantly engaged with him. PO intake has declined and is now only taking sips and bites and appears thinner with muscle wasting. His clothing fits looser now. Patient has had a functional decline with increased trunk and neck weakness. He is no longer able to maintain a sitting position without someone holding him up. Patient is on 7 liters of oxygen per n/c. His sats at rest are at 89-90%. When patient is assisted to a sitting position, his oxygen saturation drops into mid 70's,  Focus of care is comfort and family voiced desire to have patient enrolled in hospice services.   HISTORY OF PRESENT ILLNESS: This is an 86 year old man who resides in his home with his wife and caregivers. He has diagnosis including but not limited to COPD, Dementia and Pulmonary Fibroisis. Caregivers have expressed concern of patient decline and would like him to be under hospice services. Hospice MD aware and in agreement with order for Hospice evaluation. Phone call placed to PCP office to  provide update and request order for Hospice. Awaiting response CODE STATUS: DNR MOST FORM: Yes: NO CPR, limited intervention. PPS: 20%    PHYSICAL EXAM: Deferred        Maxwell Caul RN, BSN    Cornelius Moras, RN

## 2021-06-19 DIAGNOSIS — J8410 Pulmonary fibrosis, unspecified: Secondary | ICD-10-CM | POA: Diagnosis not present

## 2021-07-19 DIAGNOSIS — J8410 Pulmonary fibrosis, unspecified: Secondary | ICD-10-CM | POA: Diagnosis not present

## 2021-09-10 DEATH — deceased
